# Patient Record
Sex: Male | Born: 1960
Health system: Southern US, Community
[De-identification: ages and names within clinical notes are randomized; demographics above are authoritative.]

## PROBLEM LIST (undated history)

## (undated) DIAGNOSIS — I10 Essential (primary) hypertension: Secondary | ICD-10-CM

## (undated) DIAGNOSIS — E785 Hyperlipidemia, unspecified: Secondary | ICD-10-CM

## (undated) DIAGNOSIS — E669 Obesity, unspecified: Secondary | ICD-10-CM

## (undated) DIAGNOSIS — I499 Cardiac arrhythmia, unspecified: Secondary | ICD-10-CM

## (undated) HISTORY — DX: Cardiac arrhythmia, unspecified: I49.9

## (undated) HISTORY — PX: NECK SURGERY: SHX720

---

## 1997-08-21 ENCOUNTER — Encounter: Admission: RE | Admit: 1997-08-21 | Discharge: 1997-08-21 | Payer: Self-pay | Admitting: *Deleted

## 1999-04-20 ENCOUNTER — Emergency Department (HOSPITAL_COMMUNITY): Admission: EM | Admit: 1999-04-20 | Discharge: 1999-04-21 | Payer: Self-pay | Admitting: Internal Medicine

## 2010-05-06 ENCOUNTER — Ambulatory Visit
Admission: RE | Admit: 2010-05-06 | Discharge: 2010-05-06 | Disposition: A | Discharge status: Home / Self Care | Payer: Managed Care, Other (non HMO) | Source: Ambulatory Visit | Attending: Neurosurgery | Admitting: Neurosurgery

## 2010-05-06 ENCOUNTER — Other Ambulatory Visit: Payer: Self-pay | Admitting: Neurosurgery

## 2010-05-06 DIAGNOSIS — M5020 Other cervical disc displacement, unspecified cervical region: Secondary | ICD-10-CM

## 2012-01-17 ENCOUNTER — Emergency Department (HOSPITAL_COMMUNITY): Payer: Managed Care, Other (non HMO)

## 2012-01-17 ENCOUNTER — Emergency Department (HOSPITAL_COMMUNITY)
Admission: EM | Admit: 2012-01-17 | Discharge: 2012-01-17 | Disposition: A | Discharge status: Home / Self Care | Payer: Managed Care, Other (non HMO) | Attending: Emergency Medicine | Admitting: Emergency Medicine

## 2012-01-17 ENCOUNTER — Encounter (HOSPITAL_COMMUNITY): Payer: Self-pay | Admitting: Emergency Medicine

## 2012-01-17 DIAGNOSIS — Y9389 Activity, other specified: Secondary | ICD-10-CM | POA: Insufficient documentation

## 2012-01-17 DIAGNOSIS — Y92410 Unspecified street and highway as the place of occurrence of the external cause: Secondary | ICD-10-CM | POA: Insufficient documentation

## 2012-01-17 DIAGNOSIS — I10 Essential (primary) hypertension: Secondary | ICD-10-CM | POA: Insufficient documentation

## 2012-01-17 DIAGNOSIS — S335XXA Sprain of ligaments of lumbar spine, initial encounter: Secondary | ICD-10-CM | POA: Insufficient documentation

## 2012-01-17 DIAGNOSIS — T07XXXA Unspecified multiple injuries, initial encounter: Secondary | ICD-10-CM | POA: Insufficient documentation

## 2012-01-17 DIAGNOSIS — R159 Full incontinence of feces: Secondary | ICD-10-CM | POA: Insufficient documentation

## 2012-01-17 DIAGNOSIS — Z79899 Other long term (current) drug therapy: Secondary | ICD-10-CM | POA: Insufficient documentation

## 2012-01-17 HISTORY — DX: Essential (primary) hypertension: I10

## 2012-01-17 LAB — BASIC METABOLIC PANEL
BUN: 19 mg/dL (ref 6–23)
CO2: 25 mEq/L (ref 19–32)
Calcium: 8.9 mg/dL (ref 8.4–10.5)
Chloride: 102 mEq/L (ref 96–112)
Creatinine, Ser: 1.02 mg/dL (ref 0.50–1.35)
Glucose, Bld: 121 mg/dL — ABNORMAL HIGH (ref 70–99)

## 2012-01-17 LAB — CBC WITH DIFFERENTIAL/PLATELET
Eosinophils Relative: 2 % (ref 0–5)
HCT: 39.3 % (ref 39.0–52.0)
Hemoglobin: 13.6 g/dL (ref 13.0–17.0)
Lymphocytes Relative: 34 % (ref 12–46)
MCV: 80.5 fL (ref 78.0–100.0)
Monocytes Absolute: 0.5 10*3/uL (ref 0.1–1.0)
Monocytes Relative: 5 % (ref 3–12)
Neutro Abs: 5.5 10*3/uL (ref 1.7–7.7)
RDW: 13 % (ref 11.5–15.5)
WBC: 9.4 10*3/uL (ref 4.0–10.5)

## 2012-01-17 LAB — URINALYSIS, ROUTINE W REFLEX MICROSCOPIC
Hgb urine dipstick: NEGATIVE
Nitrite: NEGATIVE
Protein, ur: NEGATIVE mg/dL
Specific Gravity, Urine: 1.024 (ref 1.005–1.030)
Urobilinogen, UA: 0.2 mg/dL (ref 0.0–1.0)

## 2012-01-17 LAB — RAPID URINE DRUG SCREEN, HOSP PERFORMED
Amphetamines: NOT DETECTED
Barbiturates: NOT DETECTED
Tetrahydrocannabinol: POSITIVE — AB

## 2012-01-17 MED ORDER — SODIUM CHLORIDE 0.9 % IV SOLN
INTRAVENOUS | Status: DC
  Administered 2012-01-17: 13:00:00 via INTRAVENOUS

## 2012-01-17 MED ORDER — IOHEXOL 300 MG/ML  SOLN: 100.0000 mL | Freq: Once | INTRAMUSCULAR | Status: DC | PRN

## 2012-01-17 MED ORDER — IOHEXOL 300 MG/ML  SOLN
100.0000 mL | Freq: Once | INTRAMUSCULAR | Status: AC | PRN
  Administered 2012-01-17: 100 mL via INTRAVENOUS

## 2012-01-17 MED ORDER — MORPHINE SULFATE 4 MG/ML IJ SOLN
4.0000 mg | Freq: Once | INTRAMUSCULAR | Status: AC
  Administered 2012-01-17: 4 mg via INTRAVENOUS
  Filled 2012-01-17: qty 1

## 2012-01-17 MED ORDER — MORPHINE SULFATE 4 MG/ML IJ SOLN
6.0000 mg | Freq: Once | INTRAMUSCULAR | Status: AC
  Administered 2012-01-17: 6 mg via INTRAVENOUS
  Filled 2012-01-17: qty 2

## 2012-01-17 MED ORDER — OXYCODONE-ACETAMINOPHEN 5-325 MG PO TABS: 2.0000 | ORAL_TABLET | Freq: Four times a day (QID) | ORAL | Status: DC | PRN
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
## 2012-01-17 NOTE — ED Notes (Signed)
Patient transported to CT 

## 2012-04-14 ENCOUNTER — Inpatient Hospital Stay (HOSPITAL_COMMUNITY)
Admission: AD | Admit: 2012-04-14 | Discharge: 2012-04-20 | DRG: 981 | Disposition: A | Discharge status: Home / Self Care | Payer: Managed Care, Other (non HMO) | Source: Ambulatory Visit | Attending: Neurosurgery | Admitting: Neurosurgery

## 2012-04-14 ENCOUNTER — Encounter (HOSPITAL_COMMUNITY): Payer: Self-pay | Admitting: Pulmonary Disease

## 2012-04-14 DIAGNOSIS — I472 Ventricular tachycardia, unspecified: Secondary | ICD-10-CM | POA: Diagnosis present

## 2012-04-14 DIAGNOSIS — D649 Anemia, unspecified: Secondary | ICD-10-CM | POA: Diagnosis present

## 2012-04-14 DIAGNOSIS — J9600 Acute respiratory failure, unspecified whether with hypoxia or hypercapnia: Secondary | ICD-10-CM

## 2012-04-14 DIAGNOSIS — F3289 Other specified depressive episodes: Secondary | ICD-10-CM | POA: Diagnosis present

## 2012-04-14 DIAGNOSIS — I959 Hypotension, unspecified: Secondary | ICD-10-CM | POA: Diagnosis present

## 2012-04-14 DIAGNOSIS — I517 Cardiomegaly: Secondary | ICD-10-CM

## 2012-04-14 DIAGNOSIS — Z79899 Other long term (current) drug therapy: Secondary | ICD-10-CM

## 2012-04-14 DIAGNOSIS — I519 Heart disease, unspecified: Principal | ICD-10-CM | POA: Diagnosis present

## 2012-04-14 DIAGNOSIS — D72829 Elevated white blood cell count, unspecified: Secondary | ICD-10-CM | POA: Diagnosis present

## 2012-04-14 DIAGNOSIS — R7309 Other abnormal glucose: Secondary | ICD-10-CM | POA: Diagnosis present

## 2012-04-14 DIAGNOSIS — E785 Hyperlipidemia, unspecified: Secondary | ICD-10-CM | POA: Diagnosis present

## 2012-04-14 DIAGNOSIS — Y9210 Unspecified residential institution as the place of occurrence of the external cause: Secondary | ICD-10-CM | POA: Diagnosis present

## 2012-04-14 DIAGNOSIS — Y838 Other surgical procedures as the cause of abnormal reaction of the patient, or of later complication, without mention of misadventure at the time of the procedure: Secondary | ICD-10-CM | POA: Diagnosis present

## 2012-04-14 DIAGNOSIS — Z87891 Personal history of nicotine dependence: Secondary | ICD-10-CM

## 2012-04-14 DIAGNOSIS — I2510 Atherosclerotic heart disease of native coronary artery without angina pectoris: Secondary | ICD-10-CM | POA: Diagnosis present

## 2012-04-14 DIAGNOSIS — I4901 Ventricular fibrillation: Secondary | ICD-10-CM | POA: Diagnosis present

## 2012-04-14 DIAGNOSIS — Z01811 Encounter for preprocedural respiratory examination: Secondary | ICD-10-CM

## 2012-04-14 DIAGNOSIS — I10 Essential (primary) hypertension: Secondary | ICD-10-CM | POA: Diagnosis present

## 2012-04-14 DIAGNOSIS — J95821 Acute postprocedural respiratory failure: Secondary | ICD-10-CM | POA: Diagnosis present

## 2012-04-14 DIAGNOSIS — F329 Major depressive disorder, single episode, unspecified: Secondary | ICD-10-CM | POA: Diagnosis present

## 2012-04-14 DIAGNOSIS — E669 Obesity, unspecified: Secondary | ICD-10-CM | POA: Diagnosis present

## 2012-04-14 DIAGNOSIS — I4729 Other ventricular tachycardia: Secondary | ICD-10-CM | POA: Diagnosis present

## 2012-04-14 DIAGNOSIS — J9690 Respiratory failure, unspecified, unspecified whether with hypoxia or hypercapnia: Secondary | ICD-10-CM

## 2012-04-14 DIAGNOSIS — M5126 Other intervertebral disc displacement, lumbar region: Secondary | ICD-10-CM | POA: Diagnosis present

## 2012-04-14 DIAGNOSIS — I469 Cardiac arrest, cause unspecified: Secondary | ICD-10-CM | POA: Diagnosis present

## 2012-04-14 HISTORY — DX: Obesity, unspecified: E66.9

## 2012-04-14 HISTORY — DX: Hyperlipidemia, unspecified: E78.5

## 2012-04-14 LAB — CBC WITH DIFFERENTIAL/PLATELET
Hemoglobin: 13.3 g/dL (ref 13.0–17.0)
Lymphocytes Relative: 5 % — ABNORMAL LOW (ref 12–46)
Lymphs Abs: 0.8 10*3/uL (ref 0.7–4.0)
MCH: 28.9 pg (ref 26.0–34.0)
MCV: 85 fL (ref 78.0–100.0)
Monocytes Relative: 3 % (ref 3–12)
Neutrophils Relative %: 92 % — ABNORMAL HIGH (ref 43–77)
Platelets: 242 10*3/uL (ref 150–400)
RBC: 4.6 MIL/uL (ref 4.22–5.81)
WBC: 17.1 10*3/uL — ABNORMAL HIGH (ref 4.0–10.5)

## 2012-04-14 LAB — COMPREHENSIVE METABOLIC PANEL
ALT: 59 U/L — ABNORMAL HIGH (ref 0–53)
Alkaline Phosphatase: 46 U/L (ref 39–117)
BUN: 14 mg/dL (ref 6–23)
CO2: 25 mEq/L (ref 19–32)
GFR calc Af Amer: >90 mL/min (ref >90)
GFR calc non Af Amer: 85 mL/min — ABNORMAL LOW (ref >90)
Glucose, Bld: 116 mg/dL — ABNORMAL HIGH (ref 70–99)
Potassium: 4.2 mEq/L (ref 3.5–5.1)
Sodium: 139 mEq/L (ref 135–145)
Total Bilirubin: 0.4 mg/dL (ref 0.3–1.2)

## 2012-04-14 LAB — URINALYSIS, ROUTINE W REFLEX MICROSCOPIC
Bilirubin Urine: NEGATIVE
Hgb urine dipstick: NEGATIVE
Nitrite: NEGATIVE
Specific Gravity, Urine: 1.019 (ref 1.005–1.030)
Urobilinogen, UA: 0.2 mg/dL (ref 0.0–1.0)
pH: 5 (ref 5.0–8.0)

## 2012-04-14 LAB — MAGNESIUM: Magnesium: 1.6 mg/dL (ref 1.5–2.5)

## 2012-04-14 LAB — PROTIME-INR: INR: 0.99 (ref 0.00–1.49)

## 2012-04-14 LAB — TROPONIN I: Troponin I: <0.3 ng/mL (ref <0.30)

## 2012-04-14 LAB — CK TOTAL AND CKMB (NOT AT ARMC)
CK, MB: 4.5 ng/mL — ABNORMAL HIGH (ref 0.3–4.0)
Relative Index: 2.5 (ref 0.0–2.5)

## 2012-04-14 MED ORDER — DIAZEPAM 5 MG PO TABS
5.0000 mg | ORAL_TABLET | ORAL | Status: AC
  Administered 2012-04-15: 5 mg via ORAL
  Filled 2012-04-14: qty 1

## 2012-04-14 MED ORDER — ASPIRIN 300 MG RE SUPP
300.0000 mg | RECTAL | Status: AC
  Filled 2012-04-14: qty 1

## 2012-04-14 MED ORDER — SODIUM CHLORIDE 0.9 % IV SOLN
INTRAVENOUS | Status: DC
  Administered 2012-04-15: 04:00:00 via INTRAVENOUS

## 2012-04-14 MED ORDER — SODIUM CHLORIDE 0.9 % IJ SOLN: 3.0000 mL | INTRAMUSCULAR | Status: DC | PRN

## 2012-04-14 MED ORDER — FENTANYL CITRATE 0.05 MG/ML IJ SOLN
25.0000 ug | INTRAMUSCULAR | Status: DC | PRN
  Administered 2012-04-14 – 2012-04-15 (×7): 50 ug via INTRAVENOUS
  Filled 2012-04-14 (×7): qty 2

## 2012-04-14 MED ORDER — ASPIRIN 81 MG PO CHEW
324.0000 mg | CHEWABLE_TABLET | ORAL | Status: AC
  Administered 2012-04-15: 324 mg via ORAL
  Filled 2012-04-14: qty 4

## 2012-04-14 MED ORDER — ASPIRIN 81 MG PO CHEW
324.0000 mg | CHEWABLE_TABLET | ORAL | Status: AC
  Administered 2012-04-14: 324 mg via ORAL
  Filled 2012-04-14: qty 4

## 2012-04-14 MED ORDER — SODIUM CHLORIDE 0.9 % IV SOLN: 250.0000 mL | INTRAVENOUS | Status: DC | PRN

## 2012-04-14 MED ORDER — KCL IN DEXTROSE-NACL 20-5-0.45 MEQ/L-%-% IV SOLN
INTRAVENOUS | Status: DC
  Administered 2012-04-14: 16:00:00 via INTRAVENOUS
  Filled 2012-04-14 (×2): qty 1000

## 2012-04-14 MED ORDER — FENTANYL CITRATE 0.05 MG/ML IJ SOLN
INTRAMUSCULAR | Status: AC
  Filled 2012-04-14: qty 2

## 2012-04-14 MED ORDER — SODIUM CHLORIDE 0.9 % IJ SOLN
3.0000 mL | Freq: Two times a day (BID) | INTRAMUSCULAR | Status: DC
  Administered 2012-04-14: 3 mL via INTRAVENOUS

## 2012-04-14 MED ORDER — SODIUM CHLORIDE 0.9 % IV SOLN
INTRAVENOUS | Status: DC
  Administered 2012-04-14: 11:00:00 via INTRAVENOUS

## 2012-04-14 NOTE — Procedures (Signed)
Extubation Procedure Note  Patient Details:   Name: Justin Tanner DOB: [birth year] MRN: [id number]   Airway Documentation:  Airway 7.5 mm (Active)  Secured at (cm) 24 cm 03/31/2012  9:55 AM  Measured From Lips 04/02/2012  9:55 AM  Secured Location Center 04/09/2012  9:55 AM  Secured By Caron Presume Tape 03/20/2012  9:55 AM  Site Condition Dry 04/05/2012  9:55 AM    Evaluation  O2 sats: 99 Complications: No apparent complications Patient did tolerate procedure well. Bilateral Breath Sounds: Coarse crackles Suctioning: Oral;Airway Yes  Pt is awake and can follow commands. Pt has good cough/gag. Positive cuff leak. Per MD ok to extubate. Pt extubated to 4L [place]. BBS crs, rr 20, hr 88, sats 99. No stridor. Pt tol well  Kandis Nab 03/19/2012, 11:16 AM

## 2012-04-14 NOTE — Progress Notes (Signed)
  Echocardiogram 2D Echocardiogram has been performed.  Jorje Guild 03/29/2012, 1:38 PM

## 2012-04-14 NOTE — Progress Notes (Signed)
Utilization Review Completed.Bethel Gaglio T12/11/2012  

## 2012-04-14 NOTE — Progress Notes (Signed)
Initial dressing from surgical incision soaked through with sanguinous drainage; mattress pads x 2 with drainage noted; physician aware; orders received for dressing changes as needed for continued drainage; pt mae c sensation intact; will continue to monitor
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
## 2012-04-14 NOTE — H&P (Signed)
PULMONARY  / CRITICAL CARE MEDICINE  Name: Justin Tanner MRN: [id number] DOB: 1960-03-27    ADMISSION DATE:  03/27/2012  PCP: Dr. Beryle Flock MD :  Dr. Gerlene Fee  CHIEF COMPLAINT:  Intra-operative cardiac arrest  BRIEF PATIENT DESCRIPTION: 52 y/o M presented to [facility] 1/29 for lumbar micro discectomy of L4-5.  During case experienced VT arrest requiring shock x2 with CPR. Transferred to [facility] intubated.  Awake/alert on arrival and extubated to [place] O2.    SIGNIFICANT EVENTS / STUDIES:  1/29 - Admit after intra-operative cardiac arrest  LINES / TUBES: OETT 1/29>>>1/29  CULTURES: 1/29 UA>>>  ANTIBIOTICS:   HISTORY OF PRESENT ILLNESS:  52 y/o M with PMH of HTN, Obesity, Former ETOH (6pk per day), former occasional smoker who presented to [facility] 1/29 for lumbar micro discectomy of L4-5. Information obtained from CRNA - patient was stable after induction for approx 15-20 minutes.  Was given 15 mg atracurium and then became hypotensive-->50 of ephedrine with improvement in BP to 90's/100's, also given 0.2 mg Robinul for bradycardia.  Was SB in 50's with BP100/55.  Then noted to have a couple of PVC's and went into VT - no pulse check as they were trying to flip patient over to attach pads.  Once turned supine, his rhythm on Zoll was SR for approx 30 seconds and then he went back into VT-->shock x1 followed by 2 min CPR.  2nd rhythm check with VT-->shock x1 with CPR.  3rd rhythm check patient was ST with faint pulses and hypertensive.  Muscle relaxants were reversed and paralytics stopped for transfer.  Transferred to [facility] ICU intubated.  Rhythms from surgical center are faint but confirmed VT (? [place]orsades - similar in appearance).  Pt reports 3-4 month hx of intermittent chest pain associated with pre-syncopal sensation, nausea, and shortness of breath.  Denies current chest pain, nausea / vomiting, chest pain with inspiration, URI  symptoms, constipation, lower extremity swelling.  PAST MEDICAL HISTORY :  Past Medical History  Diagnosis Date  . Hypertension   . Obesity    Past Surgical History  Procedure Date  . Neck surgery    Prior to Admission medications   Medication Sig Start Date End Date Taking? Authorizing Provider  B Complex Vitamins (VITAMIN B COMPLEX PO) Take 1 tablet by mouth daily with lunch.   Yes Historical Provider, MD  citalopram (CELEXA) 40 MG tablet Take 40 mg by mouth daily.   Yes Historical Provider, MD  cyclobenzaprine (FLEXERIL) 10 MG tablet Take 10 mg by mouth 2 (two) times daily as needed. For muscle spasms.   Yes Historical Provider, MD  GARLIC PO Take 1 capsule by mouth daily with lunch.   Yes Historical Provider, MD  lisinopril-hydrochlorothiazide (PRINZIDE,ZESTORETIC) 10-12.5 MG per tablet Take 1 tablet by mouth daily.   Yes Historical Provider, MD  metoprolol succinate (TOPROL-XL) 100 MG 24 hr tablet Take 100 mg by mouth daily. Take with or immediately following a meal.   Yes Historical Provider, MD  oxyCODONE-acetaminophen (PERCOCET/ROXICET) 5-325 MG per tablet Take 2 tablets by mouth every 6 (six) hours as needed for pain. 01/17/12  Yes Doug Sou, MD  pravastatin (PRAVACHOL) 40 MG tablet Take 40 mg by mouth at bedtime.   Yes Historical Provider, MD  vitamin C (ASCORBIC ACID) 500 MG tablet Take 500 mg by mouth daily with lunch.    Yes Historical Provider, MD   No Known Allergies  FAMILY HISTORY:  No family history  on file.  SOCIAL HISTORY:  reports that he has never smoked. He does not have any smokeless tobacco history on file. He reports that he does not drink alcohol or use illicit drugs.  REVIEW OF SYSTEMS:   All systems reviewed - pertinent positives in HPI.    SUBJECTIVE:  Complains of back pain.  Denies shortness of breath / chest pain currently.   VITAL SIGNS: Pulse Rate:  [88] 88  (01/29 1005) Resp:  [18] 18  (01/29 1005) SpO2:  [99 %] 99 % (01/29 1005) FiO2  (%):  [40 %-60 %] 40 % (01/29 1000)  PHYSICAL EXAMINATION: General:  Morbidly obese WM in NAD Neuro:  AAOx4, speech clear, MAE HEENT:  Mm pink/moist, thick neck Cardiovascular:  s1s2 rrr, no m/r/g Lungs:  resp's even/non-labored, lungs bilaterally clear Abdomen:  Obese/soft, bsx4 active Musculoskeletal:  No acute deformities Skin:  Chest skin pad burn noted  No results found for this basename: NA:3,K:3,CL:3,CO2:3,BUN:3,CREATININE:3,GLUCOSE:3 in the last 168 hours No results found for this basename: HGB:3,HCT:3,WBC:3,PLT:3 in the last 168 hours No results found.  EKG:  SR with mild prolonged QT  ASSESSMENT / PLAN:  Cardiac Arrest  HTN HLD Intra-operative cardiac arrest, initial rhythm VT.  Required approx 6 minutes of CPR.  Coronary risk factors include HTN, HLD, obesity, family hx, former occ smoker, ETOH abuse.  Mild soft BP on arrival.   Plan: -Appreciate  Cardiology, likely will need cardiac cath.  Timing to be determined -ASA only for now -SCD's / hold heparin as post surgical procedure -assess troponin, EKG, ECHO -close tele monitoring -assess Hgb A1c given body habitus / risk stratification -NPO x meds -hold anti-hypertensives -hold pravachol   Status Post Microdiscectomy Acute & Chronic Pain Per Dr. Gerlene Fee s/p L4-L5 microdiscectomy.  Back closed.  Dr. Gerlene Fee indicates he needs to go back in for approx 20 minutes to finish procedure  Plan: -Per NSGY -PRN reduced dose fentanyl -hold home chronic pain medications  Acute Respiratory Failure Post CPR, extubated after arrival assessment in ICU.   Plan: -f/u cxr in am to assess for infiltrate   Depression -hold celexa   Canary Brim, NP-C Pulmonary and Critical Care Medicine [facility] Pager: [phone]  03/27/2012, 11:18 AM   Billy Fischer, MD ; [facility] service Mobile [phone].  After 5:30 PM or weekends, call [phone]

## 2012-04-14 NOTE — Progress Notes (Signed)
PULMONARY  / CRITICAL CARE MEDICINE  Name: Justin Tanner MRN: [id number] DOB: [birth year]    LOS: 1  REFERRING MD :  Dr. Gerlene Fee  CHIEF COMPLAINT: Intraoperative cardiac arrest  BRIEF PATIENT DESCRIPTION: 52 y/o M admitted 1/29 after VT arrest following lumbar micro discectomy of L4-5. PRS to require shock x2 with CPR. Transferred to [facility] intubated, extubated 129 to nasal cannula. Pending cardiac cath to evaluate for possible CAD.  LINES / TUBES: ETT 1/29 >> 1/29  CULTURES: 1/29 UA  ANTIBIOTICS: None  SIGNIFICANT EVENTS:  1/29 Admitted after intraoperative cardiac arrest, intubated --> extubated 1/29 1/29 2-D echo-LV EF 45-50%. Grade 1 diastolic dysfunction. Regional wall motion abnormalities cannot be excluded. 1/30 Cardiac cath   LEVEL OF CARE:  ICU PRIMARY SERVICE:  PCCM CONSULTANTS:   cardiology CODE STATUS:   Full DIET:  NPO DVT Px:  SCD GI Px:  Full   SUBJECTIVE / INTERVAL HISTORY:  Patient indicates doing well overnight, continues to have some chest wall pain from resuscitative efforts. Otherwise negative for shortness breath, wheezing, nausea, vomiting, diarrhea.  VITAL SIGNS: Temp:  [97.4 F (36.3 C)-98.2 F (36.8 C)] 97.9 F (36.6 C) (01/30 0400) Pulse Rate:  [65-90] 75  (01/30 0600) Resp:  [10-25] 17  (01/30 0600) BP: (85-118)/(43-77) 110/68 mmHg (01/30 0600) SpO2:  [92 %-100 %] 100 % (01/30 0600) FiO2 (%):  [40 %-60 %] 40 % (01/29 1000) Weight:  [226 lb 10.1 oz (102.8 kg)] 226 lb 10.1 oz (102.8 kg) (01/29 1005) HEMODYNAMICS:   VENTILATOR SETTINGS: Vent Mode:  [-] PSV;CPAP FiO2 (%):  [40 %-60 %] 40 % Set Rate:  [14 bmp] 14 bmp Vt Set:  [600 mL] 600 mL PEEP:  [5 cmH20] 5 cmH20 Pressure Support:  [5 cmH20] 5 cmH20 Plateau Pressure:  [30 cmH20] 30 cmH20 INTAKE / OUTPUT: Intake/Output      01/29 0701 - 01/30 0700 01/30 0701 - 01/31 0700   P.O. 160    I.V. (mL/kg) 1154.6 (11.2)    Total Intake(mL/kg) 1314.6 (12.8)    Urine (mL/kg/hr) 1050  (0.4)    Total Output 1050    Net +264.6           PHYSICAL EXAMINATION: General: Vital signs reviewed and noted. No acute distress.  Resting comfortably on nasal cannula  Head: Normocephalic, atraumatic.  Lungs:  Normal respiratory effort. Clear to auscultation BL without crackles or wheezes.  Heart: RRR. (No) murmur.  Abdomen:  BS normoactive. Soft, Nondistended, non-tender.  No masses or organomegaly.  Extremities: No pretibial edema.  Skin: No appreciable rashes, pallor  Neuro:  alert and oriented x3, no focal deficits      IMAGING: 1/29 - 2D Echo - LVEF 45-50%. Grade 1 diastolic dysfunction.   DIAGNOSES: Principal Problem:  *Sudden cardiac arrest Active Problems:  Sustained VT (ventricular tachycardia)  Respiratory failure  Preoperative respiratory examination  Hypertension   ASSESSMENT / PLAN:  PULMONARY A: Acute respiratory failure - likely secondary to cardiac arrest. Intubated and extubated 1/29. P:  Continue [place]  CARDIOVASCULAR  Lab 03/23/2012 0555 04/01/2012 1112  CKTOTAL -- 183  CKMB -- 4.5*  TROPONINI <0.30 <0.30    Lab 04/10/2012 1112  PROBNP 118.7   A:  1) Sudden V. tach cardiac arrest - likely cardiac enzymes are negative times 2.  2) Sustained VT - no further episodes overnight. 3) Hypertension - blood pressures marginal, remains off of home medications. P:  - Management per cardiology - Pending cardiac catheterization for evaluation of CAD -  Aspirin. - Resume statin, consider beta blocker.  RENAL A: No acute issues P: None  GASTROINTESTINAL  Lab 03/28/2012 0555 04/02/2012 1118  HGB 11.6* 13.3  HCT 34.9* 39.1  AST -- 63*  ALT -- 59*  ALKPHOS -- 46  BILITOT -- 0.4  ALBUMIN -- 3.5   A: 1) Mild AST/ALT elevation - mild, possibly related to his acute illness. Mild elevation could also be consistent with NASH. P: Continue to monitor.  HEMATOLOGIC  Lab 04/04/2012 0555 03/28/2012 1118  WBC 15.6* 17.1*  HGB 11.6* 13.3  HCT 34.9* 39.1  MCV  83.3 85.0  PLT 250 242  APTT -- --  INR -- 0.99   A:  1) Leukocytosis - possible stressed margination after arrest and resuscitative efforts? No clear cause of infection. UA negative. Chest x-ray pending. 2) Normocytic anemia - mild, unknown baseline. P:  Continue to monitor for signs and symptoms of infection.  INFECTIOUS  Lab 03/24/2012 0555 04/06/2012 1122 04/06/2012 1118  WBC 15.6* -- 17.1*  NEUTROABS -- -- 15.7*  LATICACIDVEN -- -- --  PROCALCITON -- <0.10 --   A: No clear source of infection identified. P: Continue to monitor.  ENDOCRINE / RHEUMATOLOGIC No results found for this basename: HGBA1C,  TSH    A: Prediabetes P: Check A1c, ICU hyperglycemia protocol.  NEUROLOGIC / PSYCHIATRIC   A: Depression - home regimen: Celexa 40 mg P: Resume home medications once taking oral.    CLINICAL SUMMARY: 52 y/o M admitted 1/29 after VT arrest following lumbar micro discectomy of L4-5. PRS to require shock x2 with CPR. Transferred to [facility] intubated, extubated 129 to nasal cannula. Pending cardiac cath to evaluate for possible CAD.   Signed: Johnette Abraham, Roma Schanz, Internal Medicine Resident Pager: [phone] (7AM-5PM)    STAFF NOTE: I, Dr Lavinia Sharps have personally reviewed patient's available data, including medical history, events of note, physical examination and test results as part of my evaluation. I have discussed with resident/NP and other care providers such as pharmacist, RN and RRT.  In addition,  I personally evaluated patient and elicited key findings of acute respiratory failure following cardiac arrest. Currently doing well. He is an ex-smoker so we will get pulmonary function testing. However looks to be low to moderate risk from risks standpoint of pulmonary complications following spine surgery.    Rest per NP/medical resident whose note is outlined above and that I agree with      Dr. Kalman Shan, M.D., [facility].C.P Pulmonary and Critical Care  Medicine Staff Physician [facility] [place] Pulmonary and Critical Care Pager: [phone], If no answer or between  15:00h - 7:00h: call [phone]  03/28/2012 4:47 PM

## 2012-04-15 ENCOUNTER — Inpatient Hospital Stay (HOSPITAL_COMMUNITY): Payer: Managed Care, Other (non HMO)

## 2012-04-15 ENCOUNTER — Encounter (HOSPITAL_COMMUNITY)
Admission: AD | Disposition: A | Discharge status: Home / Self Care | Payer: Self-pay | Source: Ambulatory Visit | Attending: Neurosurgery

## 2012-04-15 DIAGNOSIS — I2510 Atherosclerotic heart disease of native coronary artery without angina pectoris: Secondary | ICD-10-CM

## 2012-04-15 DIAGNOSIS — Z01811 Encounter for preprocedural respiratory examination: Secondary | ICD-10-CM

## 2012-04-15 HISTORY — PX: LEFT HEART CATHETERIZATION WITH CORONARY ANGIOGRAM: SHX5451

## 2012-04-15 LAB — HEMOGLOBIN A1C
Hgb A1c MFr Bld: 6 % — ABNORMAL HIGH (ref <5.7)
Mean Plasma Glucose: 126 mg/dL — ABNORMAL HIGH (ref <117)

## 2012-04-15 LAB — BASIC METABOLIC PANEL
Chloride: 104 mEq/L (ref 96–112)
GFR calc Af Amer: >90 mL/min (ref >90)
GFR calc non Af Amer: >90 mL/min (ref >90)
Potassium: 4.4 mEq/L (ref 3.5–5.1)
Sodium: 139 mEq/L (ref 135–145)

## 2012-04-15 LAB — CBC
MCHC: 33.2 g/dL (ref 30.0–36.0)
RDW: 13.4 % (ref 11.5–15.5)
WBC: 15.6 10*3/uL — ABNORMAL HIGH (ref 4.0–10.5)

## 2012-04-15 LAB — TROPONIN I: Troponin I: <0.3 ng/mL (ref <0.30)

## 2012-04-15 SURGERY — LEFT HEART CATHETERIZATION WITH CORONARY ANGIOGRAM
Anesthesia: LOCAL

## 2012-04-15 MED ORDER — OXYCODONE-ACETAMINOPHEN 5-325 MG PO TABS
2.0000 | ORAL_TABLET | Freq: Four times a day (QID) | ORAL | Status: DC | PRN
  Administered 2012-04-15 (×2): 2 via ORAL
  Filled 2012-04-15 (×2): qty 2

## 2012-04-15 MED ORDER — ONDANSETRON HCL 4 MG/2ML IJ SOLN: 4.0000 mg | Freq: Four times a day (QID) | INTRAMUSCULAR | Status: DC | PRN

## 2012-04-15 MED ORDER — MIDAZOLAM HCL 2 MG/2ML IJ SOLN
INTRAMUSCULAR | Status: AC
  Filled 2012-04-15: qty 2

## 2012-04-15 MED ORDER — CITALOPRAM HYDROBROMIDE 40 MG PO TABS
40.0000 mg | ORAL_TABLET | Freq: Every day | ORAL | Status: DC
  Administered 2012-04-15 – 2012-04-19 (×5): 40 mg via ORAL
  Filled 2012-04-15 (×5): qty 1

## 2012-04-15 MED ORDER — HEPARIN (PORCINE) IN NACL 2-0.9 UNIT/ML-% IJ SOLN
INTRAMUSCULAR | Status: AC
  Filled 2012-04-15: qty 1000

## 2012-04-15 MED ORDER — OXYCODONE-ACETAMINOPHEN 5-325 MG PO TABS: 2.0000 | ORAL_TABLET | ORAL | Status: DC | PRN

## 2012-04-15 MED ORDER — SODIUM CHLORIDE 0.9 % IV SOLN
1.0000 mL/kg/h | INTRAVENOUS | Status: AC
  Administered 2012-04-15: 1 mL/kg/h via INTRAVENOUS

## 2012-04-15 MED ORDER — LIDOCAINE HCL (PF) 1 % IJ SOLN
INTRAMUSCULAR | Status: AC
  Filled 2012-04-15: qty 30

## 2012-04-15 MED ORDER — ACETAMINOPHEN 325 MG PO TABS: 650.0000 mg | ORAL_TABLET | ORAL | Status: DC | PRN

## 2012-04-15 MED ORDER — FENTANYL CITRATE 0.05 MG/ML IJ SOLN
INTRAMUSCULAR | Status: AC
  Filled 2012-04-15: qty 2

## 2012-04-15 MED ORDER — VERAPAMIL HCL 2.5 MG/ML IV SOLN
INTRAVENOUS | Status: AC
  Filled 2012-04-15: qty 2

## 2012-04-15 MED ORDER — SIMVASTATIN 20 MG PO TABS
20.0000 mg | ORAL_TABLET | Freq: Every day | ORAL | Status: DC
  Administered 2012-04-15 – 2012-04-18 (×5): 20 mg via ORAL
  Filled 2012-04-15 (×5): qty 1

## 2012-04-15 MED ORDER — OXYCODONE-ACETAMINOPHEN 5-325 MG PO TABS
2.0000 | ORAL_TABLET | ORAL | Status: DC | PRN
  Administered 2012-04-15 – 2012-04-19 (×15): 2 via ORAL
  Filled 2012-04-15 (×15): qty 2

## 2012-04-15 MED ORDER — NITROGLYCERIN IN D5W 200-5 MCG/ML-% IV SOLN
INTRAVENOUS | Status: AC
  Filled 2012-04-15: qty 250

## 2012-04-15 MED ORDER — NITROGLYCERIN 1 MG/10 ML FOR IR/CATH LAB
INTRA_ARTERIAL | Status: AC
  Filled 2012-04-15: qty 10

## 2012-04-15 MED ORDER — HEPARIN SODIUM (PORCINE) 1000 UNIT/ML IJ SOLN
INTRAMUSCULAR | Status: AC
  Filled 2012-04-15: qty 1

## 2012-04-15 MED ORDER — METOPROLOL TARTRATE 25 MG PO TABS
25.0000 mg | ORAL_TABLET | Freq: Two times a day (BID) | ORAL | Status: DC
  Administered 2012-04-15 – 2012-04-18 (×7): 25 mg via ORAL
  Filled 2012-04-15 (×9): qty 1

## 2012-04-15 NOTE — Progress Notes (Signed)
Patient ID: Justin Tanner, male   DOB: 10-02-1960, 52 y.o.   MRN: [id number] Patient looks fine post cath. He says that the initial report is that everything looks good on his cath study, but waiting for the official report. If it is clear, then we need to discuss when he can undergo general anaesthesia to com[plete his surgery. Will look for input from cardiology and CCM.

## 2012-04-15 NOTE — Interval H&P Note (Signed)
History and Physical Interval Note:  03/28/2012 7:48 AM  Justin Tanner  has presented today for surgery, with the diagnosis of cp  The various methods of treatment have been discussed with the patient and family. After consideration of risks, benefits and other options for treatment, the patient has consented to  Procedure(s) (LRB) with comments: LEFT HEART CATHETERIZATION WITH CORONARY ANGIOGRAM (N/A) as a surgical intervention .  The patient's history has been reviewed, patient examined, no change in status, stable for surgery.  I have reviewed the patient's chart and labs.  Questions were answered to the patient's satisfaction.     Theron Arista [facility] 03/17/2012 7:48 AM

## 2012-04-15 NOTE — CV Procedure (Signed)
   Cardiac Catheterization Procedure Note  Name: Justin Tanner MRN: [id number] DOB: 07-Feb-1961  Procedure: Left Heart Cath, Selective Coronary Angiography, LV angiography  Indication: 52 year old white male who developed sustained ventricular tachycardia during lumbar discectomy.   Procedural Details: The right wrist was prepped, draped, and anesthetized with 1% lidocaine. Using the modified Seldinger technique, a 5 French sheath was introduced into the right radial artery. 3 mg of verapamil was administered through the sheath, weight-based unfractionated heparin was administered intravenously. Standard Judkins catheters were used for selective coronary angiography and left ventriculography. Catheter exchanges were performed over an exchange length guidewire. There were no immediate procedural complications. A TR band was used for radial hemostasis at the completion of the procedure.  The patient was transferred to the post catheterization recovery area for further monitoring.  Procedural Findings: Hemodynamics: AO 91/56 with a mean of 72 mmHg LV 98/19 mmHg  Coronary angiography: Coronary dominance: right  Left mainstem: Normal.  Left anterior descending (LAD): There is mild calcification in the proximal LAD. There is mild disease in the mid vessel up to 20-30%. There is also some systolic compression of the mid LAD. The diagonal has 30-40% disease proximally.  Left circumflex (LCx): The left circumflex is a large vessel which gives rise to a single large marginal branch which then trifurcates. Has mild disease less than 20%.  Right coronary artery (RCA): The right coronary is a dominant vessel. In the mid vessel there is a long segment of disease up to 30%.  Left ventriculography: Left ventricular systolic function is normal, LVEF is estimated at 65%, there is no significant mitral regurgitation   Final Conclusions:   1. Nonobstructive coronary disease. 2. Normal left ventricular  function.  Recommendations: Patient will be hydrated for his relative hypotension. We will obtain an echocardiogram. We will ask EP to see for evaluation of his ventricular tachycardia.  Theron Arista [facility] May 09, 2012, 8:17 AM

## 2012-04-15 NOTE — Consult Note (Signed)
ELECTROPHYSIOLOGY CONSULT NOTE    Patient ID: Justin Tanner MRN: [id number], DOB/AGE: 52/30/1962 52 y.o.  Admit date: 03/21/2012 Date of Consult: 03/27/2012  Primary Physician: Lesly Rubenstein, MD in [place] Primary Cardiologist: new to [facility]  Reason for Consultation: VT  HPI:  Justin Tanner is a 52 year old male whom we have been asked to see in consult for PMVT during back surgery.  His past medical history is significant for hypertension, obesity, and hyperlipidemia.  ROS is positive for snoring, daytime somnolence, reflux and back pain.  He denies chest pain, shortness of breath, palpitations, syncope, dizziness, or pre-syncope.  He saw his primary care physician in the fall of last year and was started on Metoprolol for hypertension.   He was at [facility] yesterday undergoing lumbar discectomy.  I spoke with Britta Mccreedy, RN at the surgical center who reports that patient was changed to the prone position, had several PVC's and then went into VT.  He was immediately changed back to the supine position with restoration of sinus rhythm.  He was once again placed prone and had recurrence of VT with PVC initiation.  He was then placed supine and CPR was initiated with shock from AED.  He was then transferred to [facility] for further evaluation.  Per the CRNA who was managing the patient: was stable after induction for approx 15-20 minutes. Was given atracurium and then became hypotensive with SBP 60s-->50 of ephedrine with improvement in BP to 90's/100's, also given 0.2 mg/kg Robinul for bradycardia. Was SB in 50's with BP100/55. Then noted to have a couple of PVC's and went into VT - no pulse check as they were trying to flip patient over to attach pads. Once turned supine, his rhythm on Zoll was SR for approx 30 seconds and then he went back into VT-->shock x1 followed by 2 min CPR. 2nd rhythm check with VT-->shock x1 with CPR. 3rd rhythm check patient was ST with faint pulses and  hypertensive. Muscle relaxants were reversed and paralytics stopped for transfer. Total time of arrest was 15-20 min. Transferred to [facility] ICU intubated.   The patient underwent cardiac catheterization today which demonstrated non obstructive coronary disease and normal LV function.  There is no family history of sudden cardiac death, crib death, unexplained drowning, etc.    Family history is significant for heart disease (father) and cancer (mother).   ROS is negative except as outlined above.      Past Medical History  Diagnosis Date  . Hypertension   . Obesity   . HLD (hyperlipidemia)      Surgical History:  Past Surgical History  Procedure Date  . Neck surgery      Prescriptions prior to admission  Medication Sig Dispense Refill  . B Complex Vitamins (VITAMIN B COMPLEX PO) Take 1 tablet by mouth daily with lunch.      . citalopram (CELEXA) 40 MG tablet Take 40 mg by mouth daily.      . cyclobenzaprine (FLEXERIL) 10 MG tablet Take 10 mg by mouth 2 (two) times daily as needed. For muscle spasms.      Marland Kitchen GARLIC PO Take 1 capsule by mouth daily with lunch.      . lisinopril-hydrochlorothiazide (PRINZIDE,ZESTORETIC) 10-12.5 MG per tablet Take 1 tablet by mouth daily.      . metoprolol succinate (TOPROL-XL) 100 MG 24 hr tablet Take 100 mg by mouth daily. Take with or immediately following a meal.      .  oxyCODONE-acetaminophen (PERCOCET/ROXICET) 5-325 MG per tablet Take 2 tablets by mouth every 6 (six) hours as needed for pain.  20 tablet  0  . pravastatin (PRAVACHOL) 40 MG tablet Take 40 mg by mouth at bedtime.      . vitamin C (ASCORBIC ACID) 500 MG tablet Take 500 mg by mouth daily with lunch.         Inpatient Medications:    . citalopram  40 mg Oral Daily  . simvastatin  20 mg Oral q1800    Allergies: No Known Allergies  History   Social History  . Marital Status: Single    Spouse Name: N/A    Number of Children: N/A  . Years of Education: N/A    Occupational History  . Ironworker - heavy work    Social History Main Topics  . Smoking status: Former Games developer  . Smokeless tobacco: Not on file  . Alcohol Use: No     Comment: Former ETOH use - up to 1 6pk per day.    . Drug Use: No  . Sexually Active: Not on file   Other Topics Concern  . Not on file   Social History Narrative   Lives with wife, no tobacco, borderline DM     Family History  Problem Relation Age of Onset  . Hypertension Father     Physical Exam  Well appearing middle aged man, NAD HEENT: Unremarkable Neck:  No JVD, no thyromegally Lungs:  Clear with no wheezes, rales, or rhonchi HEART:  Regular rate rhythm, no murmurs, no rubs, no clicks Abd:  Flat, positive bowel sounds, no organomegally, no rebound, no guarding Ext:  2 plus pulses, no edema, no cyanosis, no clubbing Skin:  No rashes no nodules Neuro:  CN II through XII intact, motor grossly intact   Labs:   Lab Results  Component Value Date   WBC 15.6* 03/30/2012   HGB 11.6* 03/27/2012   HCT 34.9* 03/26/2012   MCV 83.3 03/21/2012   PLT 250 03/30/2012    Lab 03/23/2012 0555 04/08/2012 1118  NA 139 --  K 4.4 --  CL 104 --  CO2 26 --  BUN 13 --  CREATININE 0.90 --  CALCIUM 8.8 --  PROT -- 6.4  BILITOT -- 0.4  ALKPHOS -- 46  ALT -- 59*  AST -- 63*  GLUCOSE 124* --   Lab Results  Component Value Date   CKTOTAL 183 03/25/2012   CKMB 4.5* 03/28/2012   TROPONINI <0.30 03/25/2012     Radiology/Studies: Dg Chest Port 1 View 04/02/2012  *RADIOLOGY REPORT*  Clinical Data: Shortness of breath.  PORTABLE CHEST - 1 VIEW  Comparison: Chest x-ray 01/17/2012.  Findings: Linear opacity at the left base is favored to represent subsegmental atelectasis.  No definite consolidative airspace disease.  No pleural effusions.  The pulmonary vasculature and the cardiomediastinal silhouette are within normal limits. Atherosclerosis in the thoracic aorta.  Orthopedic fixation hardware in the lower cervical spine.   IMPRESSION: 1.  Subsegmental atelectasis in the left lower lobe. 2.  Atherosclerosis.   Original Report Authenticated By: Trudie Reed, M.D.     WJX:[id number] rhythm, rate 67 QTc 483.  No old EKG's available to compare in MUSE.   TELEMETRY: sinus rhythm  A/P 1. PMVT/VF - etiology almost certainly related to hypotension and neosynephrine administration. He has normal LV function and no CAD. I cannot definitively rule out underlying long QT as the cause of his episode but it would be unusual for a  52 yo man to present with sudden death as his initial manifestation of long QT. I would continue beta blocker as tolerated and allow for him to return to the OR to complete his back surgery. Would avoid all QT prolonging drugs and follow ECGs daily.  Leonia Reeves.D.

## 2012-04-16 ENCOUNTER — Inpatient Hospital Stay (HOSPITAL_COMMUNITY): Payer: Managed Care, Other (non HMO)

## 2012-04-16 ENCOUNTER — Encounter (HOSPITAL_COMMUNITY): Payer: Self-pay | Admitting: Pharmacy Technician

## 2012-04-16 ENCOUNTER — Other Ambulatory Visit (HOSPITAL_COMMUNITY): Payer: Self-pay | Admitting: Radiology

## 2012-04-16 DIAGNOSIS — I2510 Atherosclerotic heart disease of native coronary artery without angina pectoris: Secondary | ICD-10-CM | POA: Diagnosis present

## 2012-04-16 LAB — PULMONARY FUNCTION TEST

## 2012-04-16 MED ORDER — ALBUTEROL SULFATE (5 MG/ML) 0.5% IN NEBU
2.5000 mg | INHALATION_SOLUTION | Freq: Once | RESPIRATORY_TRACT | Status: AC
  Administered 2012-04-16: 09:00:00 2.5 mg via RESPIRATORY_TRACT

## 2012-04-16 NOTE — Progress Notes (Signed)
    SUBJECTIVE: No complaints this am. No chest pain or SOB. Cath site right wrist is ok.   BP 107/59  Pulse 68  Temp 97.8 F (36.6 C) (Oral)  Resp 17  Ht 5\' 9"  (1.753 m)  Wt 237 lb 3.4 oz (107.6 kg)  BMI 35.03 kg/m2  SpO2 96%  Intake/Output Summary (Last 24 hours) at 03/22/2012 0700 Last data filed at 03/19/2012 0445  Gross per 24 hour  Intake 1130.8 ml  Output   2425 ml  Net -1294.2 ml    PHYSICAL EXAM General: Well developed, well nourished, in no acute distress. Alert and oriented x 3.  Psych:  Good affect, responds appropriately Neck: No JVD. No masses noted.  Lungs: Clear bilaterally with no wheezes or rhonci noted.  Heart: RRR with no murmurs noted. Abdomen: Bowel sounds are present. Soft, non-tender.  Extremities: No lower extremity edema.   LABS: Basic Metabolic Panel:  Basename 04/09/2012 0555 2012-05-01 1118  NA 139 139  K 4.4 4.2  CL 104 102  CO2 26 25  GLUCOSE 124* 116*  BUN 13 14  CREATININE 0.90 1.00  CALCIUM 8.8 8.5  MG -- 1.6  PHOS -- 2.4   CBC:  Basename 03/22/2012 0555 May 01, 2012 1118  WBC 15.6* 17.1*  NEUTROABS -- 15.7*  HGB 11.6* 13.3  HCT 34.9* 39.1  MCV 83.3 85.0  PLT 250 242   Cardiac Enzymes:  Basename 03/18/2012 0555 May 01, 2012 1112  CKTOTAL -- 183  CKMB -- 4.5*  CKMBINDEX -- --  TROPONINI <0.30 <0.30   Current Meds:    . citalopram  40 mg Oral Daily  . metoprolol tartrate  25 mg Oral BID  . simvastatin  20 mg Oral q1800   ASSESSMENT AND PLAN:  1. VT arrest during surgical procedure with general anesthesia: Cath yesterday with mild non-obstructive CAD. LV function is normal. Etiology likely hypotension and neosynephrine administration. He was seen by Electrophysiology yesterday and recommendations are to continue beta blocker, avoid QT prolonging medications and get daily EKG to check QT interval. OK to proceed with remainder of surgery from cardiac standpoint.   MCALHANY,CHRISTOPHER  1/31/20147:00 AM

## 2012-04-16 NOTE — Progress Notes (Signed)
Patient ID: Justin Tanner, male   DOB: [birth year], 52 y.o.   MRN: [id number] Subjective: Patient reports feeling well  Objective: Vital signs in last 24 hours: Temp:  [97.3 F (36.3 C)-98.4 F (36.9 C)] 98.4 F (36.9 C) (01/31 1206) Pulse Rate:  [63-92] 69  (01/31 1206) Resp:  [12-23] 12  (01/31 1206) BP: (100-124)/(57-79) 111/70 mmHg (01/31 1206) SpO2:  [95 %-99 %] 96 % (01/31 1206) Weight:  [107.6 kg (237 lb 3.4 oz)] 107.6 kg (237 lb 3.4 oz) (01/31 0030)  Intake/Output from previous day: 01/30 0701 - 01/31 0700 In: 1130.8 [I.V.:1130.8] Out: 2425 [Urine:2425] Intake/Output this shift: Total I/O In: 480 [P.O.:480] Out: 400 [Urine:400]  Wound:clean and dry  Lab Results:  [facility] 03/17/2012 0555 18-Apr-2012 1118  WBC 15.6* 17.1*  HGB 11.6* 13.3  HCT 34.9* 39.1  PLT 250 242   BMET  Basename 04/12/2012 0555 04-18-2012 1118  NA 139 139  K 4.4 4.2  CL 104 102  CO2 26 25  GLUCOSE 124* 116*  BUN 13 14  CREATININE 0.90 1.00  CALCIUM 8.8 8.5    Studies/Results: Dg Chest Port 1 View  04/05/2012  *RADIOLOGY REPORT*  Clinical Data: Shortness of breath.  PORTABLE CHEST - 1 VIEW  Comparison: Chest x-ray 01/17/2012.  Findings: Linear opacity at the left base is favored to represent subsegmental atelectasis.  No definite consolidative airspace disease.  No pleural effusions.  The pulmonary vasculature and the cardiomediastinal silhouette are within normal limits. Atherosclerosis in the thoracic aorta.  Orthopedic fixation hardware in the lower cervical spine.  IMPRESSION: 1.  Subsegmental atelectasis in the left lower lobe. 2.  Atherosclerosis.   Original Report Authenticated By: Trudie Reed, M.D.     Assessment/Plan: Doing well from cardiac stand point. Needs additional surgery to be done during good working hours here at the hospital. First time for that is 715 Monday morning. He is scheduled for then. Will proceed with surgery then. Risks benefits covered again. Questions  answered. He requests that we proceed.  LOS: 2 days  as above   Reinaldo Meeker, MD 03/20/2012, 2:30 PM

## 2012-04-16 NOTE — Progress Notes (Signed)
PULMONARY  / CRITICAL CARE MEDICINE  Name: Justin Tanner MRN: [id number] DOB: [birth year]    LOS: 2  REFERRING MD :  Dr. Gerlene Fee  CHIEF COMPLAINT: Intraoperative cardiac arrest  BRIEF PATIENT DESCRIPTION: 52 y/o M admitted 1/29 after VT arrest following lumbar micro discectomy of L4-5. PRS to require shock x2 with CPR. Transferred to [facility] MH intubated, extubated 1/29 to nasal cannula. Pending cardiac cath to evaluate for possible CAD.  LINES / TUBES: ETT 1/29 >> 1/29  CULTURES: 1/29 UA  ANTIBIOTICS: None  SIGNIFICANT EVENTS:  1/29 Admitted after intraoperative cardiac arrest, intubated --> extubated 1/29 1/29 2-D echo-LV EF 45-50%. Grade 1 diastolic dysfunction. Regional wall motion abnormalities cannot be excluded. 1/30 Cardiac cath   LEVEL OF CARE:  ICU PRIMARY SERVICE:  PCCM CONSULTANTS:   cardiology CODE STATUS:   Full DIET:  NPO DVT Px:  SCD GI Px:  Full   SUBJECTIVE / INTERVAL HISTORY:  Reports chest soreness from CPR and low back pain.  No acute events.    VITAL SIGNS: Temp:  [97.3 F (36.3 C)-98.4 F (36.9 C)] 98.4 F (36.9 C) (01/31 1206) Pulse Rate:  [63-92] 69  (01/31 1206) Resp:  [12-23] 12  (01/31 1206) BP: (100-124)/(57-79) 111/70 mmHg (01/31 1206) SpO2:  [95 %-99 %] 96 % (01/31 1206) Weight:  [237 lb 3.4 oz (107.6 kg)] 237 lb 3.4 oz (107.6 kg) (01/31 0030)  INTAKE / OUTPUT: Intake/Output      01/30 0701 - 01/31 0700 01/31 0701 - 02/01 0700   P.O.  480   I.V. (mL/kg) 1130.8 (10.5)    Total Intake(mL/kg) 1130.8 (10.5) 480 (4.5)   Urine (mL/kg/hr) 2425 (0.9) 400 (0.7)   Total Output 2425 400   Net -1294.2 +80          PHYSICAL EXAMINATION: General: No acute distress.  Resting comfortably on nasal cannula  Head: Normocephalic, atraumatic.  Lungs:  Normal respiratory effort. Clear to auscultation BL without crackles or wheezes.  Heart: RRR. (No) murmur.  Abdomen:  BS normoactive. Soft, Nondistended, non-tender.  No masses or organomegaly.   Extremities: No pretibial edema.  Skin: No appreciable rashes, pallor  Neuro:  alert and oriented x3, no focal deficits      IMAGING: 1/29 - 2D Echo - LVEF 45-50%. Grade 1 diastolic dysfunction.   DIAGNOSES: Principal Problem:  *Sudden cardiac arrest Active Problems:  Sustained VT (ventricular tachycardia)  Hypertension  Respiratory failure  Preoperative respiratory examination  Coronary atherosclerosis of native coronary artery   ASSESSMENT / PLAN:  PULMONARY A:  Acute respiratory failure - in setting of surgery / intra-op cardiac arrest. Intubated and extubated 1/29.  P:   Continue [place] Pending PFT's (done on 1/31) - likely can return to OR for completion of surgery.    CARDIOVASCULAR  Lab 03/25/2012 0555 2012-05-14 1112  CKTOTAL -- 183  CKMB -- 4.5*  TROPONINI <0.30 <0.30    Lab 2012-05-14 1112  PROBNP 118.7   A:  1) Sudden V. tach cardiac arrest - cardiac enzymes are negative times 2. Likely related to hypotension & neo administration.  Non-obs disease on cath.   2) Sustained VT - no further episodes overnight. 3) Hypertension - blood pressures marginal, remains off of home medications.  P:  - Management per cardiology - Aspirin. - Resume statin, consider beta blocker.  RENAL A: No acute issues  P: None  GASTROINTESTINAL  Lab 04/14/2012 0555 05/14/2012 1118  HGB 11.6* 13.3  HCT 34.9* 39.1  AST -- 63*  ALT -- 59*  ALKPHOS -- 46  BILITOT -- 0.4  ALBUMIN -- 3.5   A:  1) Mild AST/ALT elevation - mild, possibly related to his acute illness. Mild elevation could also be consistent with NASH.  P: Continue to monitor.  HEMATOLOGIC  Lab 03/19/2012 0555 04/02/2012 1118  WBC 15.6* 17.1*  HGB 11.6* 13.3  HCT 34.9* 39.1  MCV 83.3 85.0  PLT 250 242  APTT -- --  INR -- 0.99   A:  1) Leukocytosis - possible stressed margination after arrest and resuscitative efforts? No clear cause of infection. UA negative. Chest x-ray pending. 2) Normocytic anemia - mild,  unknown baseline.  P:  Continue to monitor for signs and symptoms of infection.  INFECTIOUS  Lab 03/26/2012 0555 04/02/2012 1122 03/24/2012 1118  WBC 15.6* -- 17.1*  NEUTROABS -- -- 15.7*  LATICACIDVEN -- -- --  PROCALCITON -- <0.10 --   A: No clear source of infection identified.  P: Continue to monitor.  ENDOCRINE / RHEUMATOLOGIC   A: Prediabetes  P:  A1c - 6 ICU hyperglycemia protocol.  NEUROLOGIC / PSYCHIATRIC   A:  Depression - home regimen: Celexa 40 mg  P: Resume home medications once taking oral.    CLINICAL SUMMARY: 52 y/o M admitted 1/29 after VT arrest following lumbar micro discectomy of L4-5. PRS to require shock x2 with CPR. Transferred to [facility] MH intubated, extubated 129 to nasal cannula. Underwent cardiac cath with mild non-obstructive disease.  PFT's pending.    Consider transfer back to Dr. Despina Arias service.   Canary Brim, NP-C [place] [facility] Pgr: [phone] or [phone] 03/27/2012 12:30 PM      STAFF NOTE: I, Dr Lavinia Sharps have personally reviewed patient's available data, including medical history, events of note, physical examination and test results as part of my evaluation. I have discussed with resident/NP and other care providers such as pharmacist, RN and RRT.  In addition,  I personally evaluated patient and elicited key findings of acute respiratory failure following cardiac arrest during surgery. Currently well from a respiratory standpoint. Pulmonary critical care medicine will sign off. Dr. Aliene Beams of neurosurgery will assume primary service as discussed with him.Marland Kitchen  Rest per NP/medical resident whose note is outlined above and that I agree with    Dr. Kalman Shan, M.D., [facility].C.P Pulmonary and Critical Care Medicine Staff Physician [facility]  Pulmonary and Critical Care Pager: [phone], If no answer or between  15:00h - 7:00h: call [phone]  04/16/2012 4:44 PM

## 2012-04-16 NOTE — Progress Notes (Signed)
Concurrent Utilization Review Completed Elania Crowl J. Constantino Starace, RN, BSN, NCM [phone]  

## 2012-04-16 NOTE — Progress Notes (Signed)
PFT completed. Unconfirmed results placed in Shadow Chart flow sheets section.

## 2012-04-17 DIAGNOSIS — 419620001 Death: Secondary | SNOMED CT

## 2012-04-17 NOTE — Progress Notes (Signed)
Patient ID: Justin Tanner, male   DOB: [birth year], 52 y.o.   MRN: [id number] Patient awaiting surgery on Monday. We'll transfer to telemetry in hospital.

## 2012-04-17 DEATH — deceased

## 2012-04-18 NOTE — Progress Notes (Signed)
Patient ID: Justin Tanner, male   DOB: 1961-02-06, 52 y.o.   MRN: [id number] For surgery in Am per Dr.Kritzer. He will obtain consent prior to surgery.

## 2012-04-19 ENCOUNTER — Inpatient Hospital Stay (HOSPITAL_COMMUNITY): Payer: Managed Care, Other (non HMO) | Admitting: Anesthesiology

## 2012-04-19 ENCOUNTER — Encounter (HOSPITAL_COMMUNITY): Payer: Self-pay | Admitting: Anesthesiology

## 2012-04-19 ENCOUNTER — Encounter (HOSPITAL_COMMUNITY)
Admission: AD | Disposition: A | Discharge status: Home / Self Care | Payer: Self-pay | Source: Ambulatory Visit | Attending: Neurosurgery

## 2012-04-19 HISTORY — PX: LUMBAR LAMINECTOMY/DECOMPRESSION MICRODISCECTOMY: SHX5026

## 2012-04-19 SURGERY — LUMBAR LAMINECTOMY/DECOMPRESSION MICRODISCECTOMY 1 LEVEL
Anesthesia: General | Site: Back | Laterality: Right | Wound class: Clean

## 2012-04-19 MED ORDER — LIDOCAINE HCL (CARDIAC) 20 MG/ML IV SOLN
INTRAVENOUS | Status: DC | PRN
  Administered 2012-04-19: 100 mg via INTRAVENOUS

## 2012-04-19 MED ORDER — CEFAZOLIN SODIUM-DEXTROSE 2-3 GM-% IV SOLR
INTRAVENOUS | Status: DC | PRN
  Administered 2012-04-19: 2 g via INTRAVENOUS

## 2012-04-19 MED ORDER — SODIUM CHLORIDE 0.9 % IJ SOLN: 3.0000 mL | INTRAMUSCULAR | Status: DC | PRN

## 2012-04-19 MED ORDER — THROMBIN 5000 UNITS EX KIT
PACK | CUTANEOUS | Status: DC | PRN
  Administered 2012-04-19 (×2): 5000 [IU] via TOPICAL

## 2012-04-19 MED ORDER — ACETAMINOPHEN 650 MG RE SUPP: 650.0000 mg | RECTAL | Status: DC | PRN

## 2012-04-19 MED ORDER — MENTHOL 3 MG MT LOZG
1.0000 | LOZENGE | OROMUCOSAL | Status: DC | PRN
  Filled 2012-04-19: qty 9

## 2012-04-19 MED ORDER — SODIUM CHLORIDE 0.9 % IR SOLN
Status: DC | PRN
  Administered 2012-04-19: 08:00:00

## 2012-04-19 MED ORDER — SODIUM CHLORIDE 0.9 % IJ SOLN
3.0000 mL | Freq: Two times a day (BID) | INTRAMUSCULAR | Status: DC
  Administered 2012-04-19: 3 mL via INTRAVENOUS

## 2012-04-19 MED ORDER — GLYCOPYRROLATE 0.2 MG/ML IJ SOLN
INTRAMUSCULAR | Status: DC | PRN
  Administered 2012-04-19: .6 mg via INTRAVENOUS
  Administered 2012-04-19: 0.1 mg via INTRAVENOUS
  Administered 2012-04-19: 0.2 mg via INTRAVENOUS
  Administered 2012-04-19: 0.1 mg via INTRAVENOUS

## 2012-04-19 MED ORDER — ONDANSETRON HCL 4 MG/2ML IJ SOLN: 4.0000 mg | Freq: Once | INTRAMUSCULAR | Status: DC | PRN

## 2012-04-19 MED ORDER — EPHEDRINE SULFATE 50 MG/ML IJ SOLN
INTRAMUSCULAR | Status: DC | PRN
  Administered 2012-04-19 (×2): 5 mg via INTRAVENOUS

## 2012-04-19 MED ORDER — HYDROMORPHONE HCL PF 1 MG/ML IJ SOLN
INTRAMUSCULAR | Status: AC
  Filled 2012-04-19: qty 1

## 2012-04-19 MED ORDER — PHENOL 1.4 % MT LIQD
1.0000 | OROMUCOSAL | Status: DC | PRN
  Filled 2012-04-19: qty 177

## 2012-04-19 MED ORDER — CEFAZOLIN SODIUM-DEXTROSE 2-3 GM-% IV SOLR
2.0000 g | Freq: Three times a day (TID) | INTRAVENOUS | Status: AC
  Administered 2012-04-19 – 2012-04-20 (×3): 2 g via INTRAVENOUS
  Filled 2012-04-19 (×4): qty 50

## 2012-04-19 MED ORDER — HYDROMORPHONE HCL PF 1 MG/ML IJ SOLN
0.2500 mg | INTRAMUSCULAR | Status: DC | PRN
  Administered 2012-04-19 (×4): 0.5 mg via INTRAVENOUS

## 2012-04-19 MED ORDER — BUPIVACAINE HCL (PF) 0.5 % IJ SOLN
INTRAMUSCULAR | Status: DC | PRN
  Administered 2012-04-19: 20 mL

## 2012-04-19 MED ORDER — PROPOFOL 10 MG/ML IV BOLUS
INTRAVENOUS | Status: DC | PRN
  Administered 2012-04-19: 300 mg via INTRAVENOUS

## 2012-04-19 MED ORDER — ACETAMINOPHEN 325 MG PO TABS: 650.0000 mg | ORAL_TABLET | ORAL | Status: DC | PRN

## 2012-04-19 MED ORDER — HEMOSTATIC AGENTS (NO CHARGE) OPTIME
TOPICAL | Status: DC | PRN
  Administered 2012-04-19: 1 via TOPICAL

## 2012-04-19 MED ORDER — CEFAZOLIN SODIUM 1-5 GM-% IV SOLN: 1.0000 g | Freq: Once | INTRAVENOUS | Status: DC

## 2012-04-19 MED ORDER — PHENYLEPHRINE HCL 10 MG/ML IJ SOLN
INTRAMUSCULAR | Status: DC | PRN
  Administered 2012-04-19: 80 ug via INTRAVENOUS

## 2012-04-19 MED ORDER — LACTATED RINGERS IV SOLN
INTRAVENOUS | Status: DC | PRN
  Administered 2012-04-19 (×2): via INTRAVENOUS

## 2012-04-19 MED ORDER — 0.9 % SODIUM CHLORIDE (POUR BTL) OPTIME
TOPICAL | Status: DC | PRN
  Administered 2012-04-19: 1000 mL

## 2012-04-19 MED ORDER — DEXAMETHASONE SODIUM PHOSPHATE 4 MG/ML IJ SOLN
4.0000 mg | Freq: Four times a day (QID) | INTRAMUSCULAR | Status: AC
  Filled 2012-04-19 (×2): qty 1

## 2012-04-19 MED ORDER — ROCURONIUM BROMIDE 100 MG/10ML IV SOLN
INTRAVENOUS | Status: DC | PRN
  Administered 2012-04-19: 10 mg via INTRAVENOUS
  Administered 2012-04-19: 40 mg via INTRAVENOUS

## 2012-04-19 MED ORDER — HYDROCODONE-ACETAMINOPHEN 5-325 MG PO TABS
1.0000 | ORAL_TABLET | ORAL | Status: DC | PRN
  Administered 2012-04-19 – 2012-04-20 (×5): 2 via ORAL
  Filled 2012-04-19 (×5): qty 2

## 2012-04-19 MED ORDER — FENTANYL CITRATE 0.05 MG/ML IJ SOLN
INTRAMUSCULAR | Status: DC | PRN
  Administered 2012-04-19: 150 ug via INTRAVENOUS

## 2012-04-19 MED ORDER — SODIUM CHLORIDE 0.9 % IV SOLN: 250.0000 mL | INTRAVENOUS | Status: DC

## 2012-04-19 MED ORDER — HYDROMORPHONE HCL PF 1 MG/ML IJ SOLN
1.0000 mg | INTRAMUSCULAR | Status: DC | PRN
  Administered 2012-04-19 (×2): 1 mg via INTRAMUSCULAR
  Filled 2012-04-19 (×3): qty 1

## 2012-04-19 MED ORDER — DEXAMETHASONE 4 MG PO TABS
4.0000 mg | ORAL_TABLET | Freq: Four times a day (QID) | ORAL | Status: AC
  Administered 2012-04-19 (×2): 4 mg via ORAL
  Filled 2012-04-19 (×2): qty 1

## 2012-04-19 MED ORDER — NEOSTIGMINE METHYLSULFATE 1 MG/ML IJ SOLN
INTRAMUSCULAR | Status: DC | PRN
  Administered 2012-04-19: 3 mg via INTRAVENOUS

## 2012-04-19 MED ORDER — KCL IN DEXTROSE-NACL 20-5-0.45 MEQ/L-%-% IV SOLN
80.0000 mL/h | INTRAVENOUS | Status: DC
  Filled 2012-04-19 (×3): qty 1000

## 2012-04-19 MED ORDER — KETOROLAC TROMETHAMINE 30 MG/ML IJ SOLN
30.0000 mg | Freq: Four times a day (QID) | INTRAMUSCULAR | Status: DC
  Administered 2012-04-19 – 2012-04-20 (×3): 30 mg via INTRAVENOUS
  Filled 2012-04-19 (×8): qty 1

## 2012-04-19 MED ORDER — HYDROMORPHONE HCL PF 1 MG/ML IJ SOLN
1.0000 mg | INTRAMUSCULAR | Status: DC | PRN
  Administered 2012-04-19: 1 mg via INTRAVENOUS

## 2012-04-19 MED ORDER — ONDANSETRON HCL 4 MG/2ML IJ SOLN: 4.0000 mg | INTRAMUSCULAR | Status: DC | PRN

## 2012-04-19 SURGICAL SUPPLY — 47 items
BAG DECANTER FOR FLEXI CONT (MISCELLANEOUS) ×2 IMPLANT
BENZOIN TINCTURE PRP APPL 2/3 (GAUZE/BANDAGES/DRESSINGS) ×2 IMPLANT
BLADE SURG ROTATE 9660 (MISCELLANEOUS) ×2 IMPLANT
BNDG COHESIVE 4X5 WHT NS (GAUZE/BANDAGES/DRESSINGS) ×2 IMPLANT
BRUSH SCRUB EZ PLAIN DRY (MISCELLANEOUS) ×2 IMPLANT
BUR CUTTER 7.0 ROUND (BURR) ×2 IMPLANT
BUR MATCHSTICK NEURO 3.0 LAGG (BURR) ×2 IMPLANT
CANISTER SUCTION 2500CC (MISCELLANEOUS) ×2 IMPLANT
CLOTH BEACON ORANGE TIMEOUT ST (SAFETY) ×2 IMPLANT
CONT SPEC 4OZ CLIKSEAL STRL BL (MISCELLANEOUS) ×2 IMPLANT
DERMABOND ADVANCED (GAUZE/BANDAGES/DRESSINGS) ×1
DERMABOND ADVANCED .7 DNX12 (GAUZE/BANDAGES/DRESSINGS) ×1 IMPLANT
DRAPE LAPAROTOMY 100X72X124 (DRAPES) ×2 IMPLANT
DRAPE MICROSCOPE LEICA (MISCELLANEOUS) ×2 IMPLANT
DRAPE MICROSCOPE ZEISS OPMI (DRAPES) IMPLANT
DRAPE SURG 17X23 STRL (DRAPES) ×4 IMPLANT
DRESSING TELFA 8X3 (GAUZE/BANDAGES/DRESSINGS) ×2 IMPLANT
ELECT REM PT RETURN 9FT ADLT (ELECTROSURGICAL) ×2
ELECTRODE REM PT RTRN 9FT ADLT (ELECTROSURGICAL) ×1 IMPLANT
GAUZE SPONGE 4X4 16PLY XRAY LF (GAUZE/BANDAGES/DRESSINGS) IMPLANT
GLOVE BIO SURGEON STRL SZ8 (GLOVE) ×2 IMPLANT
GLOVE ECLIPSE 7.5 STRL STRAW (GLOVE) ×2 IMPLANT
GLOVE INDICATOR 7.0 STRL GRN (GLOVE) ×6 IMPLANT
GLOVE INDICATOR 8.5 STRL (GLOVE) ×2 IMPLANT
GLOVE SS BIOGEL STRL SZ 6.5 (GLOVE) ×2 IMPLANT
GLOVE SUPERSENSE BIOGEL SZ 6.5 (GLOVE) ×2
GOWN BRE IMP SLV AUR LG STRL (GOWN DISPOSABLE) ×2 IMPLANT
GOWN BRE IMP SLV AUR XL STRL (GOWN DISPOSABLE) ×4 IMPLANT
GOWN STRL REIN 2XL LVL4 (GOWN DISPOSABLE) IMPLANT
KIT BASIN OR (CUSTOM PROCEDURE TRAY) ×2 IMPLANT
KIT ROOM TURNOVER OR (KITS) ×2 IMPLANT
NEEDLE HYPO 22GX1.5 SAFETY (NEEDLE) ×2 IMPLANT
NEEDLE SPNL 22GX3.5 QUINCKE BK (NEEDLE) ×2 IMPLANT
NS IRRIG 1000ML POUR BTL (IV SOLUTION) ×2 IMPLANT
PACK LAMINECTOMY NEURO (CUSTOM PROCEDURE TRAY) ×2 IMPLANT
PAD ARMBOARD 7.5X6 YLW CONV (MISCELLANEOUS) ×6 IMPLANT
PATTIES SURGICAL .75X.75 (GAUZE/BANDAGES/DRESSINGS) IMPLANT
RUBBERBAND STERILE (MISCELLANEOUS) ×4 IMPLANT
SPONGE GAUZE 4X4 12PLY (GAUZE/BANDAGES/DRESSINGS) IMPLANT
SPONGE SURGIFOAM ABS GEL SZ50 (HEMOSTASIS) ×2 IMPLANT
STRIP CLOSURE SKIN 1/2X4 (GAUZE/BANDAGES/DRESSINGS) ×2 IMPLANT
SUT VIC AB 2-0 OS6 18 (SUTURE) ×6 IMPLANT
SUT VIC AB 3-0 CP2 18 (SUTURE) ×2 IMPLANT
SYR 20ML ECCENTRIC (SYRINGE) ×2 IMPLANT
TOWEL OR 17X24 6PK STRL BLUE (TOWEL DISPOSABLE) ×2 IMPLANT
TOWEL OR 17X26 10 PK STRL BLUE (TOWEL DISPOSABLE) ×2 IMPLANT
WATER STERILE IRR 1000ML POUR (IV SOLUTION) ×2 IMPLANT

## 2012-04-19 NOTE — Addendum Note (Signed)
Addendum  created 04/30/2012 0931 by Damen Windsor P Patricia Perales, CRNA   Modules edited:Anesthesia Responsible Staff    

## 2012-04-19 NOTE — Preoperative (Signed)
Beta Blockers   Reason not to administer Beta Blockers:Not Applicable 

## 2012-04-19 NOTE — Anesthesia Postprocedure Evaluation (Signed)
  Anesthesia Post-op Note  Patient: Justin Tanner  Procedure(s) Performed: Procedure(s) (LRB) with comments: LUMBAR LAMINECTOMY/DECOMPRESSION MICRODISCECTOMY 1 LEVEL (Right) - Right lumbar four-five microdiskectomy  Patient Location: PACU  Anesthesia Type:General  Level of Consciousness: awake, oriented and patient cooperative  Airway and Oxygen Therapy: Patient Spontanous Breathing  Post-op Pain: mild  Post-op Assessment: Post-op Vital signs reviewed, Patient's Cardiovascular Status Stable, Respiratory Function Stable, Patent Airway, No signs of Nausea or vomiting and Pain level controlled  Post-op Vital Signs: stable  Complications: No apparent anesthesia complications

## 2012-04-19 NOTE — Anesthesia Procedure Notes (Signed)
Procedure Name: Intubation Date/Time: 05/05/12 7:34 AM Performed by: Rogelia Boga Pre-anesthesia Checklist: Patient identified, Emergency Drugs available, Suction available, Patient being monitored and Timeout performed Patient Re-evaluated:Patient Re-evaluated prior to inductionOxygen Delivery Method: Circle system utilized Preoxygenation: Pre-oxygenation with 100% oxygen Intubation Type: IV induction Ventilation: Mask ventilation without difficulty and Oral airway inserted - appropriate to patient size Laryngoscope Size: Mac and 4 Grade View: Grade I Tube type: Oral Tube size: 7.5 mm Number of attempts: 1 Airway Equipment and Method: Stylet Placement Confirmation: ETT inserted through vocal cords under direct vision,  positive ETCO2 and breath sounds checked- equal and bilateral Secured at: 22 cm Tube secured with: Tape Dental Injury: Teeth and Oropharynx as per pre-operative assessment  Comments: Bleeding from left front tooth. Very poor dentition.

## 2012-04-19 NOTE — Addendum Note (Signed)
Addendum  created 04/30/2012 0931 by Rogelia Boga, CRNA   Modules edited:Anesthesia Responsible Staff

## 2012-04-19 NOTE — Transfer of Care (Signed)
Immediate Anesthesia Transfer of Care Note  Patient: Justin Tanner  Procedure(s) Performed: Procedure(s) (LRB) with comments: LUMBAR LAMINECTOMY/DECOMPRESSION MICRODISCECTOMY 1 LEVEL (Right) - Right lumbar four-five microdiskectomy  Patient Location: PACU  Anesthesia Type:General  Level of Consciousness: awake, alert , oriented and patient cooperative  Airway & Oxygen Therapy: Patient Spontanous Breathing and Patient connected to nasal cannula oxygen  Post-op Assessment: Report given to PACU RN, Post -op Vital signs reviewed and stable and Patient moving all extremities X 4  Post vital signs: Reviewed and stable  Complications: No apparent anesthesia complications

## 2012-04-19 NOTE — Op Note (Signed)
Preop diagnosis: Herniated disc L4-5 right with L4 and L5 nerve root compression Postop diagnosis: Same Procedure: Right L4-5 microdiscectomy for decompression of L4 and L5 nerve roots Surgeon: Theo Reither Assistant: Pool  After being placed in the prone position the patient's back was prepped and draped in usual sterile fashion. Previous incision was opened up and using finger dissection was carried down to the previous laminotomy. Blood clot within the wound was evacuated without difficulty and self-retaining tract was placed for exposure. We dissected some early scar and dried blood clot off of the thecal sac and were able to visualize the L4 and L5 nerve roots as well as the thecal sac. The microscope was brought into the field and used for the remainder of the case. Using microdissection technique the lateral aspect of the thecal sac and L5 nerve root were identified. Further coagulation was carried out down before the canal to identify the L45 disc. The annulus was coagulated and incised a 15 blade and the disc space thoroughly cleaned out. We then inspected superiorly with a superior fragment was identified and was removed without difficulty in a piecemeal fashion. At this time the L. IV nerve root was found to be well visualized well decompressed as was the L5 nerve root and thecal sac. We inspected once more for any evidence of residual compression and none could be identified. Large amounts of irrigation were carried out and any bleeding control proper coagulation and Gelfoam. The was then closed in multiple layers of Vicryl on the muscle fascia subcutaneous and subcuticular tissues. Dermabond and Steri-Strips were placed on the skin. A sterile dressing was then applied and the patient was extubated and taken to recovery room in stable condition.

## 2012-04-19 NOTE — Progress Notes (Signed)
Patient ID: Justin Tanner, male   DOB: 1960/05/10, 53 y.o.   MRN: [id number] Patient stable over the weekend. He is ready for surgery. His questions have been answered. Anaesthesia aware of previous issues. Ready to proceed with remainder of case.

## 2012-04-19 NOTE — Anesthesia Preprocedure Evaluation (Addendum)
Anesthesia Evaluation  Patient identified by MRN, date of birth, ID band Patient awake    Reviewed: Allergy & Precautions, H&P , NPO status , Patient's Chart, lab work & pertinent test results  Airway Mallampati: II TM Distance: >3 FB Neck ROM: full    Dental  (+) Poor Dentition and Dental Advisory Given   Pulmonary          Cardiovascular hypertension, Pt. on medications and Pt. on home beta blockers + CAD + dysrhythmias Ventricular Tachycardia     Neuro/Psych    GI/Hepatic   Endo/Other    Renal/GU      Musculoskeletal   Abdominal   Peds  Hematology   Anesthesia Other Findings   Reproductive/Obstetrics                         Anesthesia Physical Anesthesia Plan  ASA: II  Anesthesia Plan: General   Post-op Pain Management:    Induction: Intravenous  Airway Management Planned: Oral ETT  Additional Equipment:   Intra-op Plan:   Post-operative Plan: Extubation in OR  Informed Consent: I have reviewed the patients History and Physical, chart, labs and discussed the procedure including the risks, benefits and alternatives for the proposed anesthesia with the patient or authorized representative who has indicated his/her understanding and acceptance.     Plan Discussed with: CRNA, Anesthesiologist and Surgeon  Anesthesia Plan Comments:         Anesthesia Quick Evaluation

## 2012-04-20 MED ORDER — CYCLOBENZAPRINE HCL 10 MG PO TABS: 10.0000 mg | ORAL_TABLET | Freq: Three times a day (TID) | ORAL | Status: DC | PRN

## 2012-04-20 MED ORDER — OXYCODONE-ACETAMINOPHEN 5-325 MG PO TABS
1.0000 | ORAL_TABLET | ORAL | Status: DC | PRN
  Administered 2012-04-20 (×2): 2 via ORAL
  Filled 2012-04-20 (×2): qty 2

## 2012-04-20 MED ORDER — OXYCODONE-ACETAMINOPHEN 10-325 MG PO TABS: 1.0000 | ORAL_TABLET | ORAL | Status: DC | PRN

## 2012-04-20 NOTE — Plan of Care (Signed)
Problem: Consults Goal: Diagnosis - Spinal Surgery Outcome: Completed/Met Microdiscectomy

## 2012-04-20 NOTE — Progress Notes (Signed)
Reviewed discharge instructions with patient and wife, they stated their understanding.  Patient states Dr. Gerlene Fee stated to followup with him next Wenesday, patient to call and make the appointment.  Patient discharged via wheelchair home with wife, states MD stated he did not need back brace.  Colman Cater

## 2012-04-20 NOTE — Plan of Care (Signed)
Problem: Consults Goal: Diagnosis - Spinal Surgery Outcome: Completed/Met Date Met:  04/20/12 Microdiscectomy/L 4-5 discectomy

## 2012-04-20 NOTE — Discharge Summary (Signed)
  Physician Discharge Summary  Patient ID: Justin Tanner MRN: [id number] DOB/AGE: 1960/07/10 52 y.o.  Admit date: 04/13/2012 Discharge date: 04/20/2012  Admission Diagnoses:  Discharge Diagnoses:  Principal Problem:  *Sudden cardiac arrest Active Problems:  Sustained VT (ventricular tachycardia)  Hypertension  Respiratory failure  Preoperative respiratory examination  Coronary atherosclerosis of native coronary artery   Discharged Condition: good  Hospital Course: Admitted emergently after cardiac arrest during routine lumbar surgery. Had cardiac work up that was negative for significant CAD. Cleared by cardiology for surgery, and underwent remainder of lumbar discectomy yesterday. Did fine. Now stable from cardiac stand point. Pain in leg markedly improved. Ready for d/c. Specific instructions given.  Consults: cardiology  Significant Diagnostic Studies: angiography: Cardiac cath  Treatments: surgery: L 45 discectomy  Discharge Exam: Blood pressure 136/86, pulse 79, temperature 97.8 F (36.6 C), temperature source Oral, resp. rate 20, height 5\' 9"  (1.753 m), weight 95.4 kg (210 lb 5.1 oz), SpO2 94.00%. Incision/Wound:clean and dry  Disposition: 01-Home or Self Care     Medication List     As of 04/20/2012  9:30 AM    ASK your doctor about these medications         citalopram 40 MG tablet   Commonly known as: CELEXA   Take 40 mg by mouth daily.      cyclobenzaprine 10 MG tablet   Commonly known as: FLEXERIL   Take 10 mg by mouth 2 (two) times daily as needed. For muscle spasms.      GARLIC PO   Take 1 capsule by mouth daily with lunch.      lisinopril-hydrochlorothiazide 10-12.5 MG per tablet   Commonly known as: PRINZIDE,ZESTORETIC   Take 1 tablet by mouth daily.      metoprolol succinate 100 MG 24 hr tablet   Commonly known as: TOPROL-XL   Take 100 mg by mouth daily. Take with or immediately following a meal.      oxyCODONE-acetaminophen 5-325 MG per  tablet   Commonly known as: PERCOCET/ROXICET   Take 2 tablets by mouth every 6 (six) hours as needed for pain.      pravastatin 40 MG tablet   Commonly known as: PRAVACHOL   Take 40 mg by mouth at bedtime.      VITAMIN B COMPLEX PO   Take 1 tablet by mouth daily with lunch.      vitamin C 500 MG tablet   Commonly known as: ASCORBIC ACID   Take 500 mg by mouth daily with lunch.         At home rest most of the time. Get up 9 or 10 times each day and take a 15 or 20 minute walk. No riding in the car and to your first postoperative appointment. If you have neck surgery you may shower from the chest down starting on the third postoperative day. If you had back surgery he may start showering on the third postoperative day with saran wrap wrapped around your incisional area 3 times. After the shower remove the saran wrap. Take pain medicine as needed and other medications as instructed. Call my office for an appointment.  SignedReinaldo Meeker, MD 04/20/2012, 9:30 AM

## 2012-04-21 ENCOUNTER — Encounter (HOSPITAL_COMMUNITY): Payer: Self-pay | Admitting: Neurosurgery

## 2012-05-15 DEATH — deceased

## 2012-05-31 ENCOUNTER — Encounter: Payer: Self-pay | Admitting: Internal Medicine
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
## 2014-02-23 ENCOUNTER — Encounter (HOSPITAL_COMMUNITY): Payer: Self-pay | Admitting: Cardiology

## 2014-10-09 DIAGNOSIS — S60410A Abrasion of right index finger, initial encounter: Secondary | ICD-10-CM | POA: Diagnosis not present

## 2014-10-09 DIAGNOSIS — S61212A Laceration without foreign body of right middle finger without damage to nail, initial encounter: Secondary | ICD-10-CM | POA: Diagnosis not present

## 2014-10-09 DIAGNOSIS — S62662A Nondisplaced fracture of distal phalanx of right middle finger, initial encounter for closed fracture: Secondary | ICD-10-CM | POA: Diagnosis not present

## 2015-01-22 DIAGNOSIS — E785 Hyperlipidemia, unspecified: Secondary | ICD-10-CM | POA: Diagnosis not present

## 2015-01-22 DIAGNOSIS — I10 Essential (primary) hypertension: Secondary | ICD-10-CM | POA: Diagnosis not present

## 2015-01-22 DIAGNOSIS — G8929 Other chronic pain: Secondary | ICD-10-CM | POA: Diagnosis not present

## 2015-01-22 DIAGNOSIS — F329 Major depressive disorder, single episode, unspecified: Secondary | ICD-10-CM | POA: Diagnosis not present

## 2015-01-22 DIAGNOSIS — Z6831 Body mass index (BMI) 31.0-31.9, adult: Secondary | ICD-10-CM | POA: Diagnosis not present

## 2015-01-22 DIAGNOSIS — Z23 Encounter for immunization: Secondary | ICD-10-CM | POA: Diagnosis not present

## 2015-01-22 DIAGNOSIS — R799 Abnormal finding of blood chemistry, unspecified: Secondary | ICD-10-CM | POA: Diagnosis not present

## 2015-02-21 DIAGNOSIS — Z6832 Body mass index (BMI) 32.0-32.9, adult: Secondary | ICD-10-CM | POA: Diagnosis not present

## 2015-02-21 DIAGNOSIS — I10 Essential (primary) hypertension: Secondary | ICD-10-CM | POA: Diagnosis not present

## 2015-02-21 DIAGNOSIS — G8929 Other chronic pain: Secondary | ICD-10-CM | POA: Diagnosis not present

## 2015-05-03 DIAGNOSIS — G8929 Other chronic pain: Secondary | ICD-10-CM | POA: Diagnosis not present

## 2015-05-03 DIAGNOSIS — M961 Postlaminectomy syndrome, not elsewhere classified: Secondary | ICD-10-CM | POA: Diagnosis not present

## 2015-05-03 DIAGNOSIS — M545 Low back pain: Secondary | ICD-10-CM | POA: Diagnosis not present

## 2015-05-10 DIAGNOSIS — J019 Acute sinusitis, unspecified: Secondary | ICD-10-CM | POA: Diagnosis not present

## 2015-05-10 DIAGNOSIS — E785 Hyperlipidemia, unspecified: Secondary | ICD-10-CM | POA: Diagnosis not present

## 2015-05-10 DIAGNOSIS — I10 Essential (primary) hypertension: Secondary | ICD-10-CM | POA: Diagnosis not present

## 2015-05-10 DIAGNOSIS — Z6833 Body mass index (BMI) 33.0-33.9, adult: Secondary | ICD-10-CM | POA: Diagnosis not present

## 2015-08-07 DIAGNOSIS — I10 Essential (primary) hypertension: Secondary | ICD-10-CM | POA: Diagnosis not present

## 2015-08-07 DIAGNOSIS — Z6832 Body mass index (BMI) 32.0-32.9, adult: Secondary | ICD-10-CM | POA: Diagnosis not present

## 2015-08-07 DIAGNOSIS — F329 Major depressive disorder, single episode, unspecified: Secondary | ICD-10-CM | POA: Diagnosis not present

## 2015-08-07 DIAGNOSIS — G8929 Other chronic pain: Secondary | ICD-10-CM | POA: Diagnosis not present

## 2015-08-07 DIAGNOSIS — E785 Hyperlipidemia, unspecified: Secondary | ICD-10-CM | POA: Diagnosis not present

## 2015-08-07 DIAGNOSIS — E669 Obesity, unspecified: Secondary | ICD-10-CM | POA: Diagnosis not present

## 2015-09-10 DIAGNOSIS — S299XXA Unspecified injury of thorax, initial encounter: Secondary | ICD-10-CM | POA: Diagnosis not present

## 2015-09-13 DIAGNOSIS — I10 Essential (primary) hypertension: Secondary | ICD-10-CM | POA: Diagnosis not present

## 2015-09-13 DIAGNOSIS — Z9181 History of falling: Secondary | ICD-10-CM | POA: Diagnosis not present

## 2015-09-13 DIAGNOSIS — E785 Hyperlipidemia, unspecified: Secondary | ICD-10-CM | POA: Diagnosis not present

## 2015-09-13 DIAGNOSIS — R042 Hemoptysis: Secondary | ICD-10-CM | POA: Diagnosis not present

## 2015-09-13 DIAGNOSIS — F329 Major depressive disorder, single episode, unspecified: Secondary | ICD-10-CM | POA: Diagnosis not present

## 2015-09-13 DIAGNOSIS — R0781 Pleurodynia: Secondary | ICD-10-CM | POA: Diagnosis not present

## 2015-09-13 DIAGNOSIS — E669 Obesity, unspecified: Secondary | ICD-10-CM | POA: Diagnosis not present

## 2015-09-13 DIAGNOSIS — M549 Dorsalgia, unspecified: Secondary | ICD-10-CM | POA: Diagnosis not present

## 2015-09-14 DIAGNOSIS — R0781 Pleurodynia: Secondary | ICD-10-CM | POA: Diagnosis not present

## 2015-09-14 DIAGNOSIS — S299XXA Unspecified injury of thorax, initial encounter: Secondary | ICD-10-CM | POA: Diagnosis not present

## 2015-11-20 DIAGNOSIS — G8929 Other chronic pain: Secondary | ICD-10-CM | POA: Diagnosis not present

## 2015-11-20 DIAGNOSIS — E669 Obesity, unspecified: Secondary | ICD-10-CM | POA: Diagnosis not present

## 2015-11-20 DIAGNOSIS — Z6832 Body mass index (BMI) 32.0-32.9, adult: Secondary | ICD-10-CM | POA: Diagnosis not present

## 2015-11-20 DIAGNOSIS — G2581 Restless legs syndrome: Secondary | ICD-10-CM | POA: Diagnosis not present

## 2015-11-20 DIAGNOSIS — Z1389 Encounter for screening for other disorder: Secondary | ICD-10-CM | POA: Diagnosis not present

## 2015-12-20 DIAGNOSIS — G8929 Other chronic pain: Secondary | ICD-10-CM | POA: Diagnosis not present

## 2015-12-20 DIAGNOSIS — E669 Obesity, unspecified: Secondary | ICD-10-CM | POA: Diagnosis not present

## 2015-12-20 DIAGNOSIS — Z1389 Encounter for screening for other disorder: Secondary | ICD-10-CM | POA: Diagnosis not present

## 2015-12-20 DIAGNOSIS — Z23 Encounter for immunization: Secondary | ICD-10-CM | POA: Diagnosis not present

## 2015-12-20 DIAGNOSIS — Z6831 Body mass index (BMI) 31.0-31.9, adult: Secondary | ICD-10-CM | POA: Diagnosis not present

## 2015-12-20 DIAGNOSIS — G2581 Restless legs syndrome: Secondary | ICD-10-CM | POA: Diagnosis not present

## 2016-06-17 DIAGNOSIS — F329 Major depressive disorder, single episode, unspecified: Secondary | ICD-10-CM | POA: Diagnosis not present

## 2016-06-17 DIAGNOSIS — Z6833 Body mass index (BMI) 33.0-33.9, adult: Secondary | ICD-10-CM | POA: Diagnosis not present

## 2016-06-17 DIAGNOSIS — R7303 Prediabetes: Secondary | ICD-10-CM | POA: Diagnosis not present

## 2016-06-17 DIAGNOSIS — I10 Essential (primary) hypertension: Secondary | ICD-10-CM | POA: Diagnosis not present

## 2016-06-17 DIAGNOSIS — E669 Obesity, unspecified: Secondary | ICD-10-CM | POA: Diagnosis not present

## 2016-06-17 DIAGNOSIS — E785 Hyperlipidemia, unspecified: Secondary | ICD-10-CM | POA: Diagnosis not present

## 2016-06-17 DIAGNOSIS — G8929 Other chronic pain: Secondary | ICD-10-CM | POA: Diagnosis not present

## 2016-07-16 DIAGNOSIS — E669 Obesity, unspecified: Secondary | ICD-10-CM | POA: Diagnosis not present

## 2016-07-16 DIAGNOSIS — G8929 Other chronic pain: Secondary | ICD-10-CM | POA: Diagnosis not present

## 2016-07-16 DIAGNOSIS — Z6832 Body mass index (BMI) 32.0-32.9, adult: Secondary | ICD-10-CM | POA: Diagnosis not present

## 2016-07-16 DIAGNOSIS — I10 Essential (primary) hypertension: Secondary | ICD-10-CM | POA: Diagnosis not present

## 2016-11-27 DIAGNOSIS — I4891 Unspecified atrial fibrillation: Secondary | ICD-10-CM | POA: Diagnosis not present

## 2016-11-27 DIAGNOSIS — F329 Major depressive disorder, single episode, unspecified: Secondary | ICD-10-CM | POA: Diagnosis not present

## 2016-11-27 DIAGNOSIS — I499 Cardiac arrhythmia, unspecified: Secondary | ICD-10-CM | POA: Diagnosis not present

## 2016-11-27 DIAGNOSIS — R7303 Prediabetes: Secondary | ICD-10-CM | POA: Diagnosis not present

## 2016-11-27 DIAGNOSIS — I10 Essential (primary) hypertension: Secondary | ICD-10-CM | POA: Diagnosis not present

## 2016-11-27 DIAGNOSIS — R221 Localized swelling, mass and lump, neck: Secondary | ICD-10-CM | POA: Diagnosis not present

## 2016-11-27 DIAGNOSIS — Z1389 Encounter for screening for other disorder: Secondary | ICD-10-CM | POA: Diagnosis not present

## 2016-11-27 DIAGNOSIS — G2581 Restless legs syndrome: Secondary | ICD-10-CM | POA: Diagnosis not present

## 2016-11-27 DIAGNOSIS — E785 Hyperlipidemia, unspecified: Secondary | ICD-10-CM | POA: Diagnosis not present

## 2016-12-01 DIAGNOSIS — R221 Localized swelling, mass and lump, neck: Secondary | ICD-10-CM | POA: Diagnosis not present

## 2016-12-01 DIAGNOSIS — E041 Nontoxic single thyroid nodule: Secondary | ICD-10-CM | POA: Diagnosis not present

## 2016-12-09 ENCOUNTER — Ambulatory Visit: Payer: Self-pay | Admitting: Cardiology

## 2016-12-15 DIAGNOSIS — J349 Unspecified disorder of nose and nasal sinuses: Secondary | ICD-10-CM | POA: Diagnosis not present

## 2016-12-15 DIAGNOSIS — R591 Generalized enlarged lymph nodes: Secondary | ICD-10-CM | POA: Diagnosis not present

## 2016-12-15 DIAGNOSIS — R221 Localized swelling, mass and lump, neck: Secondary | ICD-10-CM | POA: Diagnosis not present

## 2016-12-15 DIAGNOSIS — E041 Nontoxic single thyroid nodule: Secondary | ICD-10-CM | POA: Diagnosis not present

## 2016-12-15 DIAGNOSIS — K219 Gastro-esophageal reflux disease without esophagitis: Secondary | ICD-10-CM | POA: Diagnosis not present

## 2016-12-17 ENCOUNTER — Encounter: Payer: Self-pay | Admitting: Cardiology

## 2016-12-17 ENCOUNTER — Ambulatory Visit (HOSPITAL_BASED_OUTPATIENT_CLINIC_OR_DEPARTMENT_OTHER)
Admission: RE | Admit: 2016-12-17 | Discharge: 2016-12-17 | Disposition: A | Discharge status: Home / Self Care | Payer: Medicare Other | Source: Ambulatory Visit | Attending: Cardiology | Admitting: Cardiology

## 2016-12-17 ENCOUNTER — Ambulatory Visit (INDEPENDENT_AMBULATORY_CARE_PROVIDER_SITE_OTHER): Payer: Medicare Other | Admitting: Cardiology

## 2016-12-17 VITALS — BP 102/64 | HR 94 | Ht 69.0 in | Wt 222.0 lb

## 2016-12-17 DIAGNOSIS — R0602 Shortness of breath: Secondary | ICD-10-CM | POA: Diagnosis not present

## 2016-12-17 DIAGNOSIS — I700 Atherosclerosis of aorta: Secondary | ICD-10-CM | POA: Diagnosis not present

## 2016-12-17 DIAGNOSIS — I209 Angina pectoris, unspecified: Secondary | ICD-10-CM | POA: Insufficient documentation

## 2016-12-17 DIAGNOSIS — I10 Essential (primary) hypertension: Secondary | ICD-10-CM

## 2016-12-17 DIAGNOSIS — I469 Cardiac arrest, cause unspecified: Secondary | ICD-10-CM | POA: Diagnosis not present

## 2016-12-17 DIAGNOSIS — R079 Chest pain, unspecified: Secondary | ICD-10-CM | POA: Diagnosis not present

## 2016-12-17 DIAGNOSIS — E782 Mixed hyperlipidemia: Secondary | ICD-10-CM | POA: Insufficient documentation

## 2016-12-17 DIAGNOSIS — I4891 Unspecified atrial fibrillation: Secondary | ICD-10-CM | POA: Insufficient documentation

## 2016-12-17 MED ORDER — NITROGLYCERIN 0.4 MG SL SUBL
0.4000 mg | SUBLINGUAL_TABLET | SUBLINGUAL | 6 refills | Status: AC | PRN
Start: 2016-12-17 — End: 2017-03-17

## 2016-12-17 NOTE — Progress Notes (Signed)
Cardiology Office Note:    Date:  12/17/2016   ID:  Justin Tanner, DOB 02/09/1961, MRN [id number]  PCP:  Cole, Dawn Watkins, NP  Cardiologist:  Temesha Queener R Tajae Rybicki, MD   Referring MD: No ref. provider found    ASSESSMENT:    1. Essential hypertension   2. Sudden cardiac arrest (HCC)   3. Angina pectoris (HCC)   4. Mixed dyslipidemia    PLAN:    In order of problems listed above:  1. I'm concerned about the patient's symptoms. They are suggestive of angina and he has multiple risk factors for coronary artery disease. In view of the patient's symptoms, I discussed with the patient options for evaluation. Invasive and noninvasive options were given to the patient. I discussed stress testing and coronary angiography and left heart catheterization at length. Benefits, pros and cons of each approach were discussed at length. Patient had multiple questions which were answered to the patient's satisfaction. Patient opted for invasive evaluation and we will set up for coronary angiography and left heart catheterization. Further recommendations will be made based on the findings with coronary angiography. In the interim if the patient has any significant symptoms in hospital to the nearest emergency room. 2. His blood pressure stable. 3. I discussed with the patient atrial fibrillation, disease process. Management and therapy including rate and rhythm control, anticoagulation benefits and potential risks were discussed extensively with the patient. Patient had multiple questions which were answered to patient's satisfaction. 4. Patient's chads score is 1 and he will not need long-term anticoagulation. His coronary angiography is scheduled for the afternoon tomorrow which is a finding suggestive asked him to stop his anticoagulation. However after the coronary angiography would like him to be started on 20 mg of xarelto so that when he comes back to see me if he is asymptomatic with his  anticoagulation and would like to consider cardioversion. Eventually I will put him on 325 mg of coated aspirin. 5. Diet was discussed with dyslipidemia and verbalized understanding. He will be seen in follow-up appointment with coronary angiography.patient and his wife had multiple questions which were answered to their satisfaction. Total time for this evaluation was 60 minutes  Medication Adjustments/Labs and Tests Ordered: Current medicines are reviewed at length with the patient today.  Concerns regarding medicines are outlined above.  No orders of the defined types were placed in this encounter.  No orders of the defined types were placed in this encounter.    History of Present Illness:    Justin Tanner is a 56 y.o. male who is being seen today for the evaluation of chest pain suggesting angina at the request of his primary care provider. Patient is a pleasant 56-year-old male. He has past medical history of essential hypertension and dyslipidemia. He mentions to me that over the past several weeks when he exerts himself or findings and a stress substernal chest tightness. He went to the neck and to the arm. This of concern to him. No orthopnea or PND. He leads a sedentary lifestyle. He's had a cardiac arrest in the remote past. The time of surgery and anesthesia but he subsequently underwent a complete evaluation for this including coronary angiography and he was told that it was fine. This was many years ago. No details are available at this time.at the time of my evaluation is alert awake oriented and in no distress  Past Medical History:  Diagnosis Date  . Arrhythmia   . HLD (hyperlipidemia)   .   Hypertension   . Obesity     Past Surgical History:  Procedure Laterality Date  . LEFT HEART CATHETERIZATION WITH CORONARY ANGIOGRAM N/A 03/25/2012   Procedure: LEFT HEART CATHETERIZATION WITH CORONARY ANGIOGRAM;  Surgeon: Peter M Jordan, MD;  Location: [facility] CATH LAB;  Service:  Cardiovascular;  Laterality: N/A;  . LUMBAR LAMINECTOMY/DECOMPRESSION MICRODISCECTOMY  05/01/2012   Procedure: LUMBAR LAMINECTOMY/DECOMPRESSION MICRODISCECTOMY 1 LEVEL;  Surgeon: Randy O Kritzer, MD;  Location: [facility] NEURO ORS;  Service: Neurosurgery;  Laterality: Right;  Right lumbar four-five microdiskectomy  . NECK SURGERY      Current Medications: Current Meds  Medication Sig  . B Complex Vitamins (VITAMIN B COMPLEX PO) Take 1 tablet by mouth daily with lunch.  . citalopram (CELEXA) 40 MG tablet Take 40 mg by mouth daily.  . gabapentin (NEURONTIN) 600 MG tablet Take 600 mg by mouth 3 (three) times daily.  . gemfibrozil (LOPID) 600 MG tablet Take 600 mg by mouth 2 (two) times daily.  . lisinopril-hydrochlorothiazide (PRINZIDE,ZESTORETIC) 10-12.5 MG per tablet Take 1 tablet by mouth daily.  . metoprolol succinate (TOPROL-XL) 100 MG 24 hr tablet Take 100 mg by mouth daily. Take with or immediately following a meal.  . pramipexole (MIRAPEX) 0.25 MG tablet Take 0.25 mg by mouth daily as needed.  . pravastatin (PRAVACHOL) 40 MG tablet Take 40 mg by mouth at bedtime.  . vitamin C (ASCORBIC ACID) 500 MG tablet Take 500 mg by mouth daily with lunch.   . XARELTO 15 MG TABS tablet Take 20 mg by mouth daily. Start this new dose once given the okay after the heart catheterization     Allergies:   Patient has no known allergies.   Social History   Social History  . Marital status: Single    Spouse name: N/A  . Number of children: N/A  . Years of education: N/A   Occupational History  . Ironworker - heavy work    Social History Main Topics  . Smoking status: Former Smoker  . Smokeless tobacco: Former User  . Alcohol use No     Comment: Former ETOH use - up to 1 6pk per day.    . Drug use: No  . Sexual activity: Not Asked   Other Topics Concern  . None   Social History Narrative   Lives with wife, no tobacco, borderline DM     Family History: The patient's family history includes  Heart disease in his father and mother; Hypertension in his father.  ROS:   Please see the history of present illness.    All other systems reviewed and are negative.  EKGs/Labs/Other Studies Reviewed:    The following studies were reviewed today: I reviewed her primary care office notes, lab work and EKGs extensively and discussed the findings with the patient.   Recent Labs: No results found for requested labs within last 8760 hours.  Recent Lipid Panel No results found for: CHOL, TRIG, HDL, CHOLHDL, VLDL, LDLCALC, LDLDIRECT  Physical Exam:    VS:  BP 102/64   Pulse 94   Ht 5' 9" (1.753 m)   Wt 222 lb 0.6 oz (100.7 kg)   SpO2 98%   BMI 32.79 kg/m     Wt Readings from Last 3 Encounters:  12/17/16 222 lb 0.6 oz (100.7 kg)  05/02/2012 210 lb 5.1 oz (95.4 kg)     GEN: Patient is in no acute distress HEENT: Normal NECK: No JVD; No carotid bruits LYMPHATICS: No lymphadenopathy CARDIAC: S1 S2 regular,   2/6 systolic murmur at the apex. RESPIRATORY:  Clear to auscultation without rales, wheezing or rhonchi  ABDOMEN: Soft, non-tender, non-distended MUSCULOSKELETAL:  No edema; No deformity  SKIN: Warm and dry NEUROLOGIC:  Alert and oriented x 3 PSYCHIATRIC:  Normal affect    Signed, Sofie Schendel R Lola Czerwonka, MD  12/17/2016 9:49 AM    [facility]   

## 2016-12-17 NOTE — Patient Instructions (Addendum)
Medication Instructions:  Your physician has recommended you make the following change in your medication: CHANGE Xarelto dose changed from  to ; start the new dose once told okay after the heart catheterization.  DO NOT TAKE XARELTO UNTIL TOLD TO DO SO AFTER THE HEART CATHETERIZATION  Labwork: Your physician recommends that you need: BMP, CBC, and PT/INR.  Testing/Procedures: A chest x-ray takes a picture of the organs and structures inside the chest, including the heart, lungs, and blood vessels. This test can show several things, including, whether the heart is enlarges; whether fluid is building up in the lungs; and whether pacemaker / defibrillator leads are still in place.  Your physician has requested that you have a cardiac catheterization. Cardiac catheterization is used to diagnose and/or treat various heart conditions. Doctors may recommend this procedure for a number of different reasons. The most common reason is to evaluate chest pain. Chest pain can be a symptom of coronary artery disease (CAD), and cardiac catheterization can show whether plaque is narrowing or blocking your heart's arteries. This procedure is also used to evaluate the valves, as well as measure the blood flow and oxygen levels in different parts of your heart. For further information please visit [email or web address] Please follow instruction sheet, as given.  Your physician has requested that you have an echocardiogram. Echocardiography is a painless test that uses sound waves to create images of your heart. It provides your doctor with information about the size and shape of your heart and how well your heart's chambers and valves are working. This procedure takes approximately one hour. There are no restrictions for this procedure.     Follow-Up: 2-3 weeks dependent upon cath  Any Other Special Instructions Will Be Listed Below (If Applicable).     If you need a refill on your cardiac medications  before your next appointment, please call your pharmacy.   Coronary Angiogram With Stent Coronary angiogram with stent placement is a procedure to widen or open a narrow blood vessel of the heart (coronary artery). Arteries may become blocked by cholesterol buildup (plaques) in the lining or wall. When a coronary artery becomes partially blocked, blood flow to that area decreases. This may lead to chest pain or a heart attack (myocardial infarction). A stent is a small piece of metal that looks like mesh or a spring. Stent placement may be done as treatment for a heart attack or right after a coronary angiogram in which a blocked artery is found. Let your health care provider know about:  Any allergies you have.  All medicines you are taking, including vitamins, herbs, eye drops, creams, and over-the-counter medicines.  Any problems you or family members have had with anesthetic medicines.  Any blood disorders you have.  Any surgeries you have had.  Any medical conditions you have.  Whether you are pregnant or may be pregnant. What are the risks? Generally, this is a safe procedure. However, problems may occur, including:  Damage to the heart or its blood vessels.  A return of blockage.  Bleeding, infection, or bruising at the insertion site.  A collection of blood under the skin (hematoma) at the insertion site.  A blood clot in another part of the body.  Kidney injury.  Allergic reaction to the dye or contrast that is used.  Bleeding into the abdomen (retroperitoneal bleeding).  What happens before the procedure? Staying hydrated Follow instructions from your health care provider about hydration, which may include:  Up to 2 hours before  the procedure - you may continue to drink clear liquids, such as water, clear fruit juice, black coffee, and plain tea.  Eating and drinking restrictions Follow instructions from your health care provider about eating and drinking,  which may include:  8 hours before the procedure - stop eating heavy meals or foods such as meat, fried foods, or fatty foods.  6 hours before the procedure - stop eating light meals or foods, such as toast or cereal.  2 hours before the procedure - stop drinking clear liquids.  Ask your health care provider about:  Changing or stopping your regular medicines. This is especially important if you are taking diabetes medicines or blood thinners.  Taking medicines such as ibuprofen. These medicines can thin your blood. Do not take these medicines before your procedure if your health care provider instructs you not to. Generally, aspirin is recommended before a procedure of passing a small, thin tube (catheter) through a blood vessel and into the heart (cardiac catheterization).  What happens during the procedure?  An IV tube will be inserted into one of your veins.  You will be given one or more of the following: ? A medicine to help you relax (sedative). ? A medicine to numb the area where the catheter will be inserted into an artery (local anesthetic).  To reduce your risk of infection: ? Your health care team will wash or sanitize their hands. ? Your skin will be washed with soap. ? Hair may be removed from the area where the catheter will be inserted.  Using a guide wire, the catheter will be inserted into an artery. The location may be in your groin, in your wrist, or in the fold of your arm (near your elbow).  A type of X-ray (fluoroscopy) will be used to help guide the catheter to the opening of the arteries in the heart.  A dye will be injected into the catheter, and X-rays will be taken. The dye will help to show where any narrowing or blockages are located in the arteries.  A tiny wire will be guided to the blocked spot, and a balloon will be inflated to make the artery wider.  The stent will be expanded and will crush the plaques into the wall of the vessel. The stent will  hold the area open and improve the blood flow. Most stents have a drug coating to reduce the risk of the stent narrowing over time.  The artery may be made wider using a drill, laser, or other tools to remove plaques.  When the blood flow is better, the catheter will be removed. The lining of the artery will grow over the stent, which stays where it was placed. This procedure may vary among health care providers and hospitals. What happens after the procedure?  If the procedure is done through the leg, you will be kept in bed lying flat for about 6 hours. You will be instructed to not bend and not cross your legs.  The insertion site will be checked frequently.  The pulse in your foot or wrist will be checked frequently.  You may have additional blood tests, X-rays, and a test that records the electrical activity of your heart (electrocardiogram, or ECG). This information is not intended to replace advice given to you by your health care provider. Make sure you discuss any questions you have with your health care provider. Document Released: 09/07/2002 Document Revised: 11/01/2015 Document Reviewed: 10/07/2015 Elsevier Interactive Patient Education  2017 [employer]  Inc.  

## 2016-12-18 ENCOUNTER — Telehealth: Payer: Self-pay

## 2016-12-18 LAB — BASIC METABOLIC PANEL
BUN / CREAT RATIO: 19 (ref 9–20)
BUN: 27 mg/dL — AB (ref 6–24)
CO2: 20 mmol/L (ref 20–29)
Calcium: 9.4 mg/dL (ref 8.7–10.2)
Chloride: 104 mmol/L (ref 96–106)
Creatinine, Ser: 1.42 mg/dL — ABNORMAL HIGH (ref 0.76–1.27)
GFR, EST AFRICAN AMERICAN: 63 mL/min/{1.73_m2} (ref >59)
GFR, EST NON AFRICAN AMERICAN: 55 mL/min/{1.73_m2} — AB (ref >59)
Glucose: 114 mg/dL — ABNORMAL HIGH (ref 65–99)
Potassium: 4.6 mmol/L (ref 3.5–5.2)
Sodium: 141 mmol/L (ref 134–144)

## 2016-12-18 LAB — CBC WITH DIFFERENTIAL
BASOS: 1 %
Basophils Absolute: 0.1 10*3/uL (ref 0.0–0.2)
EOS (ABSOLUTE): 0.3 10*3/uL (ref 0.0–0.4)
EOS: 2 %
HEMATOCRIT: 42.5 % (ref 37.5–51.0)
HEMOGLOBIN: 14 g/dL (ref 13.0–17.7)
Immature Grans (Abs): 0 10*3/uL (ref 0.0–0.1)
Immature Granulocytes: 0 %
LYMPHS ABS: 5.2 10*3/uL — AB (ref 0.7–3.1)
Lymphs: 41 %
MCH: 27.4 pg (ref 26.6–33.0)
MCHC: 32.9 g/dL (ref 31.5–35.7)
MCV: 83 fL (ref 79–97)
MONOCYTES: 7 %
MONOS ABS: 0.9 10*3/uL (ref 0.1–0.9)
NEUTROS ABS: 6.1 10*3/uL (ref 1.4–7.0)
Neutrophils: 49 %
RBC: 5.11 x10E6/uL (ref 4.14–5.80)
RDW: 14.6 % (ref 12.3–15.4)
WBC: 12.6 10*3/uL — ABNORMAL HIGH (ref 3.4–10.8)

## 2016-12-18 LAB — PROTIME-INR
INR: 1 (ref 0.8–1.2)
PROTHROMBIN TIME: 10.5 s (ref 9.1–12.0)

## 2016-12-18 NOTE — Telephone Encounter (Signed)
Patient contacted pre-catheterization at [facility] scheduled for:  12/19/2016 @ 1200 Verified arrival time and place:  NT @ 1000 Confirmed AM meds to be taken pre-cath with sip of water: Take ASA  Hold lisin/HCTZ-wife states unsure if he held this Bernette Mayers have taken dose on 10/3-wife unsure when he takes this Confirmed patient has responsible person to drive home post procedure and observe patient for 24 hours: yes Addl concerns:   Cr 1.42- may have taken Lisi/HCTZ, may have taken Xarelto on 12/17/2016- ask patient upon arrival to short stay

## 2016-12-19 ENCOUNTER — Ambulatory Visit (HOSPITAL_COMMUNITY)
Admission: RE | Admit: 2016-12-19 | Discharge: 2016-12-19 | Disposition: A | Discharge status: Home / Self Care | Payer: Medicare Other | Source: Ambulatory Visit | Attending: Cardiovascular Disease | Admitting: Cardiovascular Disease

## 2016-12-19 ENCOUNTER — Encounter (HOSPITAL_COMMUNITY)
Admission: RE | Disposition: A | Discharge status: Home / Self Care | Payer: Self-pay | Source: Ambulatory Visit | Attending: Cardiovascular Disease

## 2016-12-19 DIAGNOSIS — E669 Obesity, unspecified: Secondary | ICD-10-CM | POA: Insufficient documentation

## 2016-12-19 DIAGNOSIS — Z87891 Personal history of nicotine dependence: Secondary | ICD-10-CM | POA: Insufficient documentation

## 2016-12-19 DIAGNOSIS — E782 Mixed hyperlipidemia: Secondary | ICD-10-CM | POA: Insufficient documentation

## 2016-12-19 DIAGNOSIS — I10 Essential (primary) hypertension: Secondary | ICD-10-CM | POA: Insufficient documentation

## 2016-12-19 DIAGNOSIS — Z79899 Other long term (current) drug therapy: Secondary | ICD-10-CM | POA: Insufficient documentation

## 2016-12-19 DIAGNOSIS — I2510 Atherosclerotic heart disease of native coronary artery without angina pectoris: Secondary | ICD-10-CM | POA: Diagnosis present

## 2016-12-19 DIAGNOSIS — I25119 Atherosclerotic heart disease of native coronary artery with unspecified angina pectoris: Secondary | ICD-10-CM | POA: Diagnosis not present

## 2016-12-19 DIAGNOSIS — I25118 Atherosclerotic heart disease of native coronary artery with other forms of angina pectoris: Secondary | ICD-10-CM

## 2016-12-19 DIAGNOSIS — E785 Hyperlipidemia, unspecified: Secondary | ICD-10-CM | POA: Insufficient documentation

## 2016-12-19 DIAGNOSIS — Z8674 Personal history of sudden cardiac arrest: Secondary | ICD-10-CM | POA: Insufficient documentation

## 2016-12-19 DIAGNOSIS — Z7901 Long term (current) use of anticoagulants: Secondary | ICD-10-CM | POA: Insufficient documentation

## 2016-12-19 HISTORY — PX: LEFT HEART CATH AND CORONARY ANGIOGRAPHY: CATH118249

## 2016-12-19 LAB — BASIC METABOLIC PANEL
ANION GAP: 5 (ref 5–15)
BUN: 22 mg/dL — ABNORMAL HIGH (ref 6–20)
CALCIUM: 8.9 mg/dL (ref 8.9–10.3)
CO2: 24 mmol/L (ref 22–32)
Chloride: 104 mmol/L (ref 101–111)
Creatinine, Ser: 0.95 mg/dL (ref 0.61–1.24)
GFR calc non Af Amer: >60 mL/min (ref >60)
GLUCOSE: 133 mg/dL — AB (ref 65–99)
POTASSIUM: 5.5 mmol/L — AB (ref 3.5–5.1)
SODIUM: 133 mmol/L — AB (ref 135–145)

## 2016-12-19 SURGERY — LEFT HEART CATH AND CORONARY ANGIOGRAPHY
Anesthesia: LOCAL

## 2016-12-19 MED ORDER — MIDAZOLAM HCL 2 MG/2ML IJ SOLN
INTRAMUSCULAR | Status: DC | PRN
  Administered 2016-12-19: 1 mg via INTRAVENOUS

## 2016-12-19 MED ORDER — MIDAZOLAM HCL 2 MG/2ML IJ SOLN
INTRAMUSCULAR | Status: AC
  Filled 2016-12-19: qty 2

## 2016-12-19 MED ORDER — FENTANYL CITRATE (PF) 100 MCG/2ML IJ SOLN
INTRAMUSCULAR | Status: AC
  Filled 2016-12-19: qty 2

## 2016-12-19 MED ORDER — SODIUM CHLORIDE 0.9 % WEIGHT BASED INFUSION
3.0000 mL/kg/h | INTRAVENOUS | Status: AC
  Administered 2016-12-19: 3 mL/kg/h via INTRAVENOUS

## 2016-12-19 MED ORDER — VERAPAMIL HCL 2.5 MG/ML IV SOLN
INTRAVENOUS | Status: DC | PRN
  Administered 2016-12-19: 10 mL via INTRA_ARTERIAL

## 2016-12-19 MED ORDER — SODIUM CHLORIDE 0.9 % WEIGHT BASED INFUSION: 1.0000 mL/kg/h | INTRAVENOUS | Status: DC

## 2016-12-19 MED ORDER — HEPARIN SODIUM (PORCINE) 1000 UNIT/ML IJ SOLN
INTRAMUSCULAR | Status: DC | PRN
  Administered 2016-12-19: 5000 [IU] via INTRAVENOUS

## 2016-12-19 MED ORDER — HEPARIN (PORCINE) IN NACL 2-0.9 UNIT/ML-% IJ SOLN
INTRAMUSCULAR | Status: AC
  Filled 2016-12-19: qty 1000

## 2016-12-19 MED ORDER — SODIUM CHLORIDE 0.9% FLUSH: 3.0000 mL | INTRAVENOUS | Status: DC | PRN

## 2016-12-19 MED ORDER — IOPAMIDOL (ISOVUE-370) INJECTION 76%
INTRAVENOUS | Status: AC
  Filled 2016-12-19: qty 100

## 2016-12-19 MED ORDER — SODIUM CHLORIDE 0.9 % IV SOLN: 250.0000 mL | INTRAVENOUS | Status: DC | PRN

## 2016-12-19 MED ORDER — SODIUM CHLORIDE 0.9% FLUSH: 3.0000 mL | Freq: Two times a day (BID) | INTRAVENOUS | Status: DC

## 2016-12-19 MED ORDER — IOPAMIDOL (ISOVUE-370) INJECTION 76%
INTRAVENOUS | Status: DC | PRN
  Administered 2016-12-19: 90 mL via INTRA_ARTERIAL

## 2016-12-19 MED ORDER — SODIUM CHLORIDE 0.9 % IV SOLN: INTRAVENOUS | Status: DC

## 2016-12-19 MED ORDER — ASPIRIN 81 MG PO CHEW: 81.0000 mg | CHEWABLE_TABLET | ORAL | Status: DC

## 2016-12-19 MED ORDER — FENTANYL CITRATE (PF) 100 MCG/2ML IJ SOLN
INTRAMUSCULAR | Status: DC | PRN
  Administered 2016-12-19: 25 ug via INTRAVENOUS

## 2016-12-19 MED ORDER — VERAPAMIL HCL 2.5 MG/ML IV SOLN
INTRAVENOUS | Status: AC
  Filled 2016-12-19: qty 2

## 2016-12-19 MED ORDER — HEPARIN (PORCINE) IN NACL 2-0.9 UNIT/ML-% IJ SOLN
INTRAMUSCULAR | Status: AC | PRN
  Administered 2016-12-19: 1000 mL via INTRA_ARTERIAL

## 2016-12-19 MED ORDER — HEPARIN SODIUM (PORCINE) 1000 UNIT/ML IJ SOLN
INTRAMUSCULAR | Status: AC
  Filled 2016-12-19: qty 1

## 2016-12-19 MED ORDER — LIDOCAINE HCL 2 % IJ SOLN
INTRAMUSCULAR | Status: DC | PRN
  Administered 2016-12-19: 3 mL via INTRADERMAL

## 2016-12-19 MED ORDER — LIDOCAINE HCL 2 % IJ SOLN
INTRAMUSCULAR | Status: AC
  Filled 2016-12-19: qty 10

## 2016-12-19 SURGICAL SUPPLY — 10 items
CATH IMPULSE 5F ANG/FL3.5 (CATHETERS) ×2 IMPLANT
DEVICE RAD COMP TR BAND LRG (VASCULAR PRODUCTS) ×2 IMPLANT
GLIDESHEATH SLEND SS 6F .021 (SHEATH) ×2 IMPLANT
GUIDEWIRE INQWIRE 1.5J.035X260 (WIRE) ×1 IMPLANT
INQWIRE 1.5J .035X260CM (WIRE) ×2
KIT HEART LEFT (KITS) ×2 IMPLANT
PACK CARDIAC CATHETERIZATION (CUSTOM PROCEDURE TRAY) ×2 IMPLANT
SYR MEDRAD MARK V 150ML (SYRINGE) ×2 IMPLANT
TRANSDUCER W/STOPCOCK (MISCELLANEOUS) ×2 IMPLANT
TUBING CIL FLEX 10 FLL-RA (TUBING) ×2 IMPLANT

## 2016-12-19 NOTE — Discharge Instructions (Signed)
Radial Site Care Refer to this sheet in the next few weeks. These instructions provide you with information about caring for yourself after your procedure. Your health care provider may also give you more specific instructions. Your treatment has been planned according to current medical practices, but problems sometimes occur. Call your health care provider if you have any problems or questions after your procedure. What can I expect after the procedure? After your procedure, it is typical to have the following:  Bruising at the radial site that usually fades within 1-2 weeks.  Blood collecting in the tissue (hematoma) that may be painful to the touch. It should usually decrease in size and tenderness within 1-2 weeks.  Follow these instructions at home:  Take medicines only as directed by your health care provider.  You may shower 24-48 hours after the procedure or as directed by your health care provider. Remove the bandage (dressing) and gently wash the site with plain soap and water. Pat the area dry with a clean towel. Do not rub the site, because this may cause bleeding.  Do not take baths, swim, or use a hot tub until your health care provider approves.  Check your insertion site every day for redness, swelling, or drainage.  Do not apply powder or lotion to the site.  Do not flex or bend the affected arm for 24 hours or as directed by your health care provider.  Do not push or pull heavy objects with the affected arm for 24 hours or as directed by your health care provider.  Do not lift over 10 lb (4.5 kg) for 5 days after your procedure or as directed by your health care provider.  Ask your health care provider when it is okay to: ? Return to work or school. ? Resume usual physical activities or sports. ? Resume sexual activity.  Do not drive home if you are discharged the same day as the procedure. Have someone else drive you.  You may drive 24 hours after the procedure  unless otherwise instructed by your health care provider.  Do not operate machinery or power tools for 24 hours after the procedure.  If your procedure was done as an outpatient procedure, which means that you went home the same day as your procedure, a responsible adult should be with you for the first 24 hours after you arrive home.  Keep all follow-up visits as directed by your health care provider. This is important. Contact a health care provider if:  You have a fever.  You have chills.  You have increased bleeding from the radial site. Hold pressure on the site. CALL 911 Get help right away if:  You have unusual pain at the radial site.  You have redness, warmth, or swelling at the radial site.  You have drainage (other than a small amount of blood on the dressing) from the radial site.  The radial site is bleeding, and the bleeding does not stop after 30 minutes of holding steady pressure on the site.  Your arm or hand becomes pale, cool, tingly, or numb. This information is not intended to replace advice given to you by your health care provider. Make sure you discuss any questions you have with your health care provider. Document Released: 04/05/2010 Document Revised: 08/09/2015 Document Reviewed: 09/19/2013 Elsevier Interactive Patient Education  2018 [employer].   RESUME XARELTO on 12/20/16 if no bleeding from cath site.

## 2016-12-19 NOTE — H&P (View-Only) (Signed)
Cardiology Office Note:    Date:  12/17/2016   ID:  Justin Tanner, DOB 11-16-1960, MRN [id number]  PCP:  Kendra Opitz, NP  Cardiologist:  Garwin Brothers, MD   Referring MD: No ref. provider found    ASSESSMENT:    1. Essential hypertension   2. Sudden cardiac arrest (HCC)   3. Angina pectoris (HCC)   4. Mixed dyslipidemia    PLAN:    In order of problems listed above:  1. I'm concerned about the patient's symptoms. They are suggestive of angina and he has multiple risk factors for coronary artery disease. In view of the patient's symptoms, I discussed with the patient options for evaluation. Invasive and noninvasive options were given to the patient. I discussed stress testing and coronary angiography and left heart catheterization at length. Benefits, pros and cons of each approach were discussed at length. Patient had multiple questions which were answered to the patient's satisfaction. Patient opted for invasive evaluation and we will set up for coronary angiography and left heart catheterization. Further recommendations will be made based on the findings with coronary angiography. In the interim if the patient has any significant symptoms in hospital to the nearest emergency room. 2. His blood pressure stable. 3. I discussed with the patient atrial fibrillation, disease process. Management and therapy including rate and rhythm control, anticoagulation benefits and potential risks were discussed extensively with the patient. Patient had multiple questions which were answered to patient's satisfaction. 4. Patient's chads score is 1 and he will not need long-term anticoagulation. His coronary angiography is scheduled for the afternoon tomorrow which is a finding suggestive asked him to stop his anticoagulation. However after the coronary angiography would like him to be started on 20 mg of xarelto so that when he comes back to see me if he is asymptomatic with his  anticoagulation and would like to consider cardioversion. Eventually I will put him on 325 mg of coated aspirin. 5. Diet was discussed with dyslipidemia and verbalized understanding. He will be seen in follow-up appointment with coronary angiography.patient and his wife had multiple questions which were answered to their satisfaction. Total time for this evaluation was 60 minutes  Medication Adjustments/Labs and Tests Ordered: Current medicines are reviewed at length with the patient today.  Concerns regarding medicines are outlined above.  No orders of the defined types were placed in this encounter.  No orders of the defined types were placed in this encounter.    History of Present Illness:    Justin Tanner is a 56 y.o. male who is being seen today for the evaluation of chest pain suggesting angina at the request of his primary care provider. Patient is a pleasant 56 year old male. He has past medical history of essential hypertension and dyslipidemia. He mentions to me that over the past several weeks when he exerts himself or findings and a stress substernal chest tightness. He went to the neck and to the arm. This of concern to him. No orthopnea or PND. He leads a sedentary lifestyle. He's had a cardiac arrest in the remote past. The time of surgery and anesthesia but he subsequently underwent a complete evaluation for this including coronary angiography and he was told that it was fine. This was many years ago. No details are available at this time.at the time of my evaluation is alert awake oriented and in no distress  Past Medical History:  Diagnosis Date  . Arrhythmia   . HLD (hyperlipidemia)   .  Hypertension   . Obesity     Past Surgical History:  Procedure Laterality Date  . LEFT HEART CATHETERIZATION WITH CORONARY ANGIOGRAM N/A 03/17/2012   Procedure: LEFT HEART CATHETERIZATION WITH CORONARY ANGIOGRAM;  Surgeon: Peter M Swaziland, MD;  Location: [facility] CATH LAB;  Service:  Cardiovascular;  Laterality: N/A;  . LUMBAR LAMINECTOMY/DECOMPRESSION MICRODISCECTOMY  05/14/2012   Procedure: LUMBAR LAMINECTOMY/DECOMPRESSION MICRODISCECTOMY 1 LEVEL;  Surgeon: Reinaldo Meeker, MD;  Location: [facility] NEURO ORS;  Service: Neurosurgery;  Laterality: Right;  Right lumbar four-five microdiskectomy  . NECK SURGERY      Current Medications: Current Meds  Medication Sig  . B Complex Vitamins (VITAMIN B COMPLEX PO) Take 1 tablet by mouth daily with lunch.  . citalopram (CELEXA) 40 MG tablet Take 40 mg by mouth daily.  Marland Kitchen gabapentin (NEURONTIN) 600 MG tablet Take 600 mg by mouth 3 (three) times daily.  Marland Kitchen gemfibrozil (LOPID) 600 MG tablet Take 600 mg by mouth 2 (two) times daily.  Marland Kitchen lisinopril-hydrochlorothiazide (PRINZIDE,ZESTORETIC) 10-12.5 MG per tablet Take 1 tablet by mouth daily.  . metoprolol succinate (TOPROL-XL) 100 MG 24 hr tablet Take 100 mg by mouth daily. Take with or immediately following a meal.  . pramipexole (MIRAPEX) 0.25 MG tablet Take 0.25 mg by mouth daily as needed.  . pravastatin (PRAVACHOL) 40 MG tablet Take 40 mg by mouth at bedtime.  . vitamin C (ASCORBIC ACID) 500 MG tablet Take 500 mg by mouth daily with lunch.   Carlena Hurl 15 MG TABS tablet Take 20 mg by mouth daily. Start this new dose once given the okay after the heart catheterization     Allergies:   Patient has no known allergies.   Social History   Social History  . Marital status: Single    Spouse name: N/A  . Number of children: N/A  . Years of education: N/A   Occupational History  . Ironworker - heavy work    Social History Main Topics  . Smoking status: Former Games developer  . Smokeless tobacco: Former Neurosurgeon  . Alcohol use No     Comment: Former ETOH use - up to 1 6pk per day.    . Drug use: No  . Sexual activity: Not Asked   Other Topics Concern  . None   Social History Narrative   Lives with wife, no tobacco, borderline DM     Family History: The patient's family history includes  Heart disease in his father and mother; Hypertension in his father.  ROS:   Please see the history of present illness.    All other systems reviewed and are negative.  EKGs/Labs/Other Studies Reviewed:    The following studies were reviewed today: I reviewed her primary care office notes, lab work and EKGs extensively and discussed the findings with the patient.   Recent Labs: No results found for requested labs within last 8760 hours.  Recent Lipid Panel No results found for: CHOL, TRIG, HDL, CHOLHDL, VLDL, LDLCALC, LDLDIRECT  Physical Exam:    VS:  BP 102/64   Pulse 94   Ht  (1.753 m)   Wt 222 lb 0.6 oz (100.7 kg)   SpO2 98%   BMI 32.79 kg/m     Wt Readings from Last 3 Encounters:  12/17/16 222 lb 0.6 oz (100.7 kg)  04/27/2012 210 lb 5.1 oz (95.4 kg)     GEN: Patient is in no acute distress HEENT: Normal NECK: No JVD; No carotid bruits LYMPHATICS: No lymphadenopathy CARDIAC: S1 S2 regular,  2/6 systolic murmur at the apex. RESPIRATORY:  Clear to auscultation without rales, wheezing or rhonchi  ABDOMEN: Soft, non-tender, non-distended MUSCULOSKELETAL:  No edema; No deformity  SKIN: Warm and dry NEUROLOGIC:  Alert and oriented x 3 PSYCHIATRIC:  Normal affect    Signed, Garwin Brothers, MD  12/17/2016 9:49 AM    [facility]

## 2016-12-19 NOTE — Interval H&P Note (Signed)
History and Physical Interval Note:  12/19/2016 11:41 AM  Justin Tanner  has presented today for cardiac cath  with the diagnosis of CAD, unstable angina. The various methods of treatment have been discussed with the patient and family. After consideration of risks, benefits and other options for treatment, the patient has consented to  Procedure(s): LEFT HEART CATH AND CORONARY ANGIOGRAPHY (N/A) as a surgical intervention .  The patient's history has been reviewed, patient examined, no change in status, stable for surgery.  I have reviewed the patient's chart and labs.  Questions were answered to the patient's satisfaction.    Cath Lab Visit (complete for each Cath Lab visit)  Clinical Evaluation Leading to the Procedure:   ACS: No.  Non-ACS:    Anginal Classification: CCS III  Anti-ischemic medical therapy: Minimal Therapy (1 class of medications)  Non-Invasive Test Results: No non-invasive testing performed  Prior CABG: No previous CABG         Verne Carrow

## 2016-12-22 ENCOUNTER — Encounter (HOSPITAL_COMMUNITY): Payer: Self-pay | Admitting: Cardiovascular Disease

## 2017-01-02 ENCOUNTER — Ambulatory Visit (HOSPITAL_BASED_OUTPATIENT_CLINIC_OR_DEPARTMENT_OTHER): Payer: Medicare Other

## 2017-01-13 ENCOUNTER — Ambulatory Visit: Payer: Medicare Other | Admitting: Cardiology

## 2017-01-20 ENCOUNTER — Ambulatory Visit (HOSPITAL_BASED_OUTPATIENT_CLINIC_OR_DEPARTMENT_OTHER): Payer: Medicare Other

## 2017-01-23 ENCOUNTER — Ambulatory Visit: Payer: Medicare Other | Admitting: Cardiology

## 2017-02-19 ENCOUNTER — Ambulatory Visit (HOSPITAL_BASED_OUTPATIENT_CLINIC_OR_DEPARTMENT_OTHER): Payer: Medicare Other

## 2017-02-23 ENCOUNTER — Ambulatory Visit: Payer: Medicare Other | Admitting: Cardiology

## 2017-02-24 ENCOUNTER — Ambulatory Visit: Payer: Medicare Other | Admitting: Cardiology

## 2017-03-03 ENCOUNTER — Ambulatory Visit: Payer: Medicare Other | Admitting: Cardiology

## 2017-03-04 ENCOUNTER — Encounter: Payer: Self-pay | Admitting: Cardiology

## 2017-03-12 ENCOUNTER — Other Ambulatory Visit: Payer: Self-pay

## 2017-03-12 MED ORDER — XARELTO 15 MG PO TABS
15.0000 mg | ORAL_TABLET | Freq: Every day | ORAL | 1 refills | Status: DC
Start: 2017-03-12 — End: 2022-02-12

## 2017-03-12 NOTE — Telephone Encounter (Signed)
Follow up phone call to schedule appointment with Dr. Tomie Chinaevankar. Patient understands the necessity of appointment post heart cath. Patient shared concern regarding his xarelto; states that he has not taken it since his heart cath as he was unsure if he was supposed to be taking this. Per Dr. Dulce SellarMunley the patient is okay to take this; refill sent.

## 2017-03-20 ENCOUNTER — Other Ambulatory Visit: Payer: Self-pay

## 2017-03-20 ENCOUNTER — Encounter: Payer: Self-pay | Admitting: Cardiology

## 2017-03-20 ENCOUNTER — Ambulatory Visit (INDEPENDENT_AMBULATORY_CARE_PROVIDER_SITE_OTHER): Payer: Medicare Other | Admitting: Cardiology

## 2017-03-20 VITALS — BP 110/76 | Ht 69.0 in | Wt 230.8 lb

## 2017-03-20 DIAGNOSIS — I10 Essential (primary) hypertension: Secondary | ICD-10-CM

## 2017-03-20 DIAGNOSIS — I480 Paroxysmal atrial fibrillation: Secondary | ICD-10-CM

## 2017-03-20 DIAGNOSIS — I2510 Atherosclerotic heart disease of native coronary artery without angina pectoris: Secondary | ICD-10-CM

## 2017-03-20 DIAGNOSIS — E782 Mixed hyperlipidemia: Secondary | ICD-10-CM

## 2017-03-20 NOTE — Patient Instructions (Signed)
Medication Instructions:  Your physician recommends that you continue on your current medications as directed. Please refer to the Current Medication list given to you today.  Labwork: None  Testing/Procedures: None  Follow-Up: Your physician recommends that you schedule a follow-up appointment in: 6 months  Any Other Special Instructions Will Be Listed Below (If Applicable).     If you need a refill on your cardiac medications before your next appointment, please call your pharmacy.   [facility]  Marlaya Turck A, RN, BSN  

## 2017-03-20 NOTE — Progress Notes (Signed)
Cardiology Office Note:    Date:  03/20/2017   ID:  Justin Tanner, DOB 12/21/1960, MRN [id number]  PCP:  Patient, No Pcp Per  Cardiologist:  Garwin Brothersajan R Nirvan Laban, MD   Referring MD: No ref. provider found    ASSESSMENT:    1. Coronary artery disease involving native coronary artery of native heart without angina pectoris   2. Essential hypertension   3. Paroxysmal atrial fibrillation (HCC)   4. Mixed dyslipidemia    PLAN:    In order of problems listed above:  1. In the prevention stressed with the patient.  Importance of compliance with diet and medications stressed and the patient vocalized understanding. 2. Lipids are followed by primary care physician.  Diet was discussed. 3. EKG done today reveals sinus rhythm and nonspecific ST-T changes. 4. His chads score is 1 he will take and continue taking full-strength aspirin. 5. Patient will be seen in follow-up appointment in 6 months or earlier if the patient has any concerns    Medication Adjustments/Labs and Tests Ordered: Current medicines are reviewed at length with the patient today.  Concerns regarding medicines are outlined above.  Orders Placed This Encounter  Procedures  . EKG 12-Lead   No orders of the defined types were placed in this encounter.    Chief Complaint  Patient presents with  . Heart Cath Follow Up    x7 days, R arm pain radiating to hand, intermittent, 9 on pain scale,      History of Present Illness:    Justin Tanner is a 57 y.o. male.  The patient has known coronary artery disease.  He denies any problems from a cardiovascular standpoint.  He takes care of activities of daily living.  He denies any chest pain orthopnea or PND.  He mentions to me that he had a fall and has pain on the right side since the fall.  He has not seen any doctor for this.  He ambulated well, in and out of her office today.  Past Medical History:  Diagnosis Date  . Arrhythmia   . HLD (hyperlipidemia)   .  Hypertension   . Obesity     Past Surgical History:  Procedure Laterality Date  . LEFT HEART CATH AND CORONARY ANGIOGRAPHY N/A 12/19/2016   Procedure: LEFT HEART CATH AND CORONARY ANGIOGRAPHY;  Surgeon: Kathleene HazelMcAlhany, Christopher D, MD;  Location: [facility] INVASIVE CV LAB;  Service: Cardiovascular;  Laterality: N/A;  . LEFT HEART CATHETERIZATION WITH CORONARY ANGIOGRAM N/A 02/11/2013   Procedure: LEFT HEART CATHETERIZATION WITH CORONARY ANGIOGRAM;  Surgeon: Peter M SwazilandJordan, MD;  [facility];  Service: Cardiovascular;  Laterality: N/A;  . LUMBAR LAMINECTOMY/DECOMPRESSION MICRODISCECTOMY  05/08/2012   Procedure: LUMBAR LAMINECTOMY/DECOMPRESSION MICRODISCECTOMY 1 LEVEL;  Surgeon: Reinaldo Meekerandy O Kritzer, MD;  Location: [facility] NEURO ORS;  Service: Neurosurgery;  Laterality: Right;  Right lumbar four-five microdiskectomy  . NECK SURGERY      Current Medications: Current Meds  Medication Sig  . aspirin EC 81 MG tablet Take 81 mg by mouth daily.  . citalopram (CELEXA) 40 MG tablet Take 40 mg by mouth daily.  Marland Kitchen. gabapentin (NEURONTIN) 600 MG tablet Take 600 mg by mouth 3 (three) times daily.  Marland Kitchen. gemfibrozil (LOPID) 600 MG tablet Take 600 mg by mouth 2 (two) times daily.  Marland Kitchen. lisinopril-hydrochlorothiazide (PRINZIDE,ZESTORETIC) 10-12.5 MG per tablet Take 1 tablet by mouth daily.  . metoprolol succinate (TOPROL-XL) 100 MG 24 hr tablet Take 100 mg by mouth daily. Take with or immediately  following a meal.  . pravastatin (PRAVACHOL) 40 MG tablet Take 40 mg by mouth at bedtime.  Carlena Hurl 15 MG TABS tablet Take 1 tablet (15 mg total) by mouth daily with supper. Start this new dose once given the okay after the heart catheterization     Allergies:   Patient has no known allergies.   Social History   Socioeconomic History  . Marital status: Married    Spouse name: None  . Number of children: None  . Years of education: None  . Highest education level: None  Social Needs  . Financial resource strain: None  . Food  insecurity - worry: None  . Food insecurity - inability: None  . Transportation needs - medical: None  . Transportation needs - non-medical: None  Occupational History  . 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 Smoking status: Former Games developer  . Smokeless tobacco: Former Engineer, water and Sexual Activity  . Alcohol use: No    Comment: Former ETOH use - up to 1 6pk per day.    . Drug use: No  . Sexual activity: None  Other Topics Concern  . None  Social History Narrative   Lives with wife, no tobacco, borderline DM     Family History: The patient's family history includes Heart disease in his father and mother; Hypertension in his father.  ROS:   Please see the history of present illness.    All other systems reviewed and are negative.  EKGs/Labs/Other Studies Reviewed:    The following studies were reviewed today: I discussed the findings of recent visits and coronary angiography again with the patient.   Recent Labs: 12/17/2016: Hemoglobin 14.0 12/19/2016: BUN 22; Creatinine, Ser 0.95; Potassium 5.5; Sodium 133  Recent Lipid Panel No results found for: CHOL, TRIG, HDL, CHOLHDL, VLDL, LDLCALC, LDLDIRECT  Physical Exam:    VS:  BP 110/76 (BP Location: Left Arm, Patient Position: Sitting, Cuff Size: Large)   Ht 5\' 9"  (1.753 m)   Wt 230 lb 12.8 oz (104.7 kg)   BMI 34.08 kg/m     Wt Readings from Last 3 Encounters:  03/20/17 230 lb 12.8 oz (104.7 kg)  12/19/16 222 lb (100.7 kg)  12/17/16 222 lb 0.6 oz (100.7 kg)     GEN: Patient is in no acute distress HEENT: Normal NECK: No JVD; No carotid bruits LYMPHATICS: No lymphadenopathy CARDIAC: Hear sounds regular, 2/6 systolic murmur at the apex. RESPIRATORY:  Clear to auscultation without rales, wheezing or rhonchi  ABDOMEN: Soft, non-tender, non-distended MUSCULOSKELETAL:  No edema; No deformity  SKIN: Warm and dry NEUROLOGIC:  Alert and oriented x 3 PSYCHIATRIC:  Normal affect   Signed, Garwin Brothers, MD  03/20/2017 3:37 PM    [facility]

## 2017-10-10 IMAGING — DX DG CHEST 2V
2 series · 2 of 2 positions shown · non-contrast
Comparison: Chest x-ray of September 14, 2015

CLINICAL DATA: Chest pain and shortness of breath. History of
coronary artery disease, cardiac dysrhythmia, hyperlipidemia,
nonsmoker.

EXAM:
CHEST  2 VIEW

[chest pa]
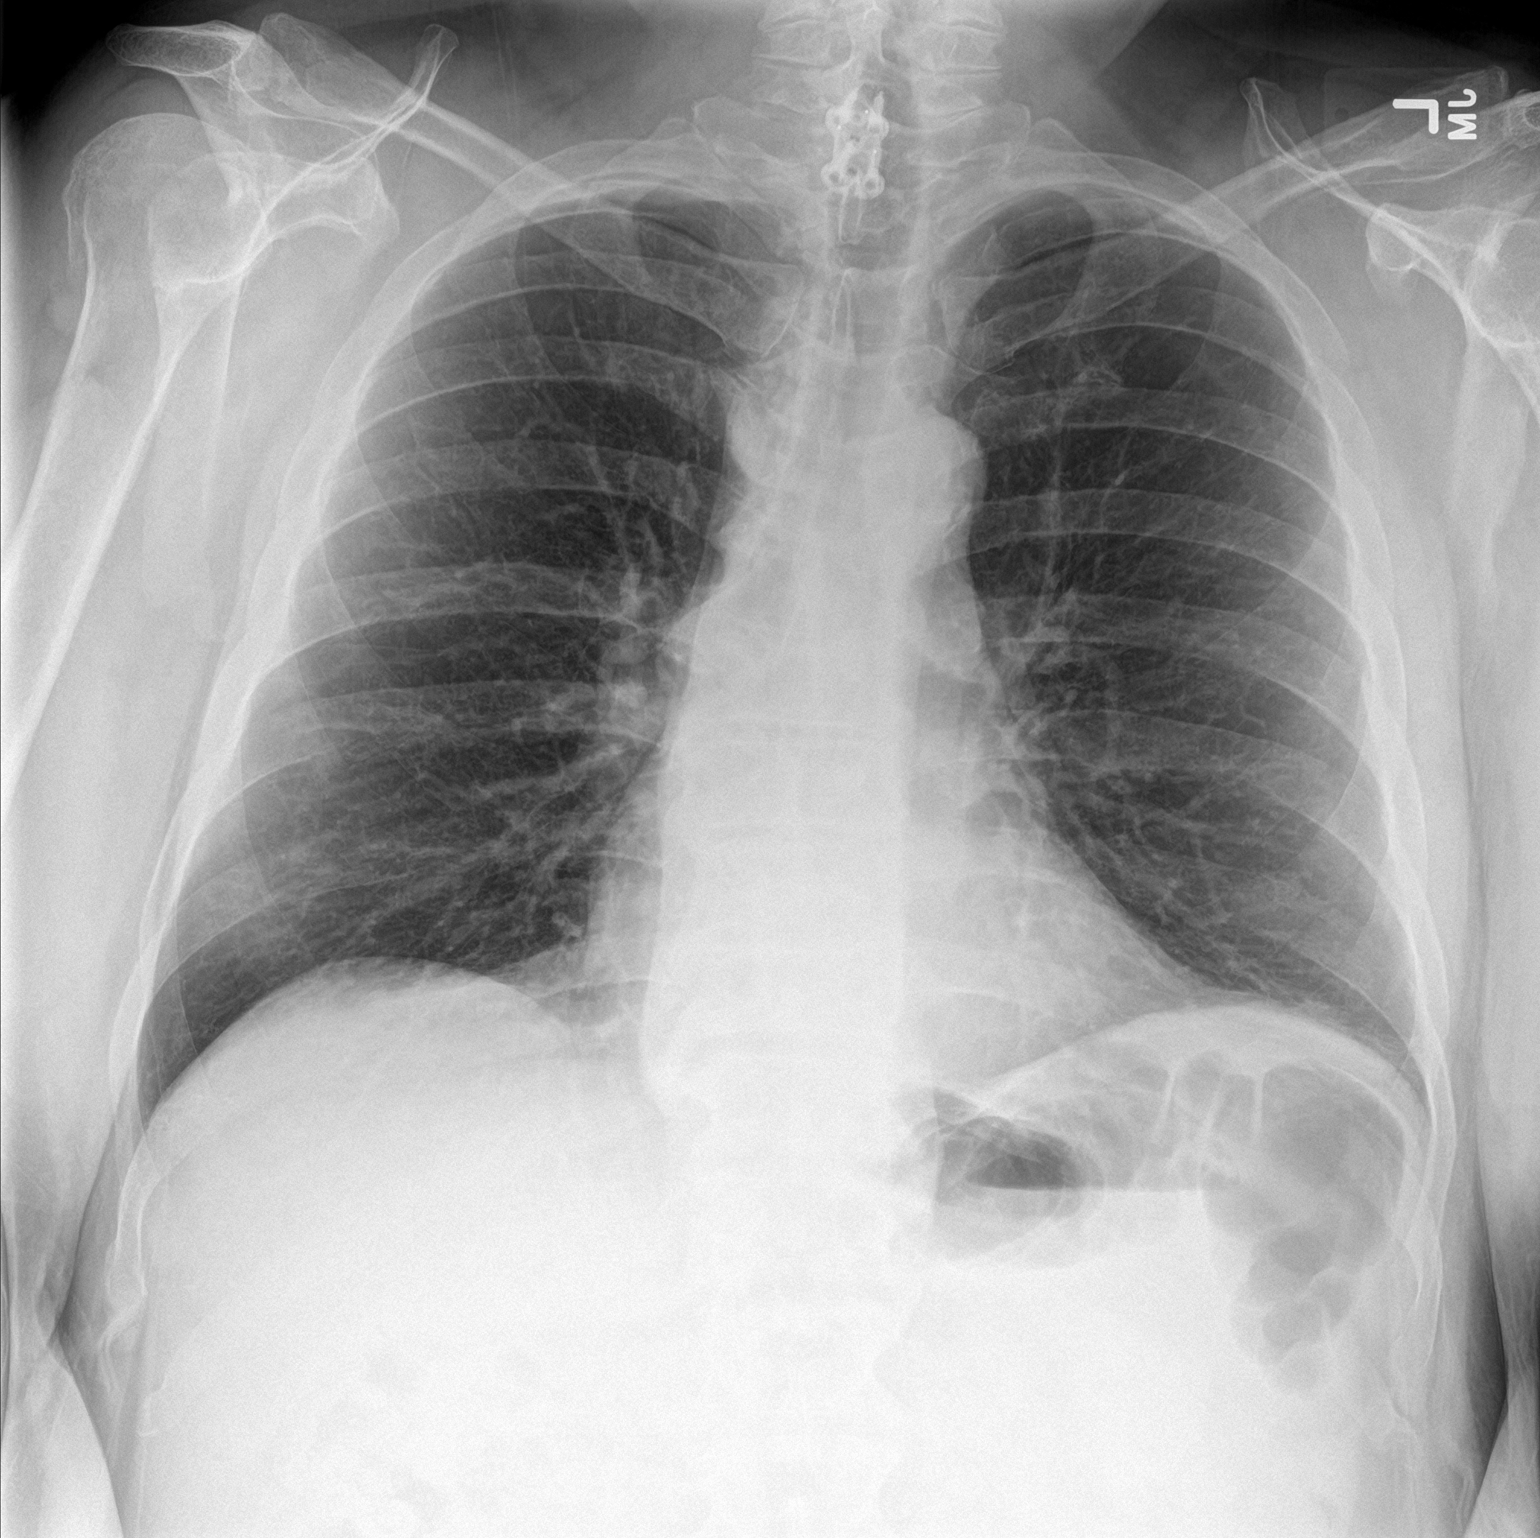

[chest lat]
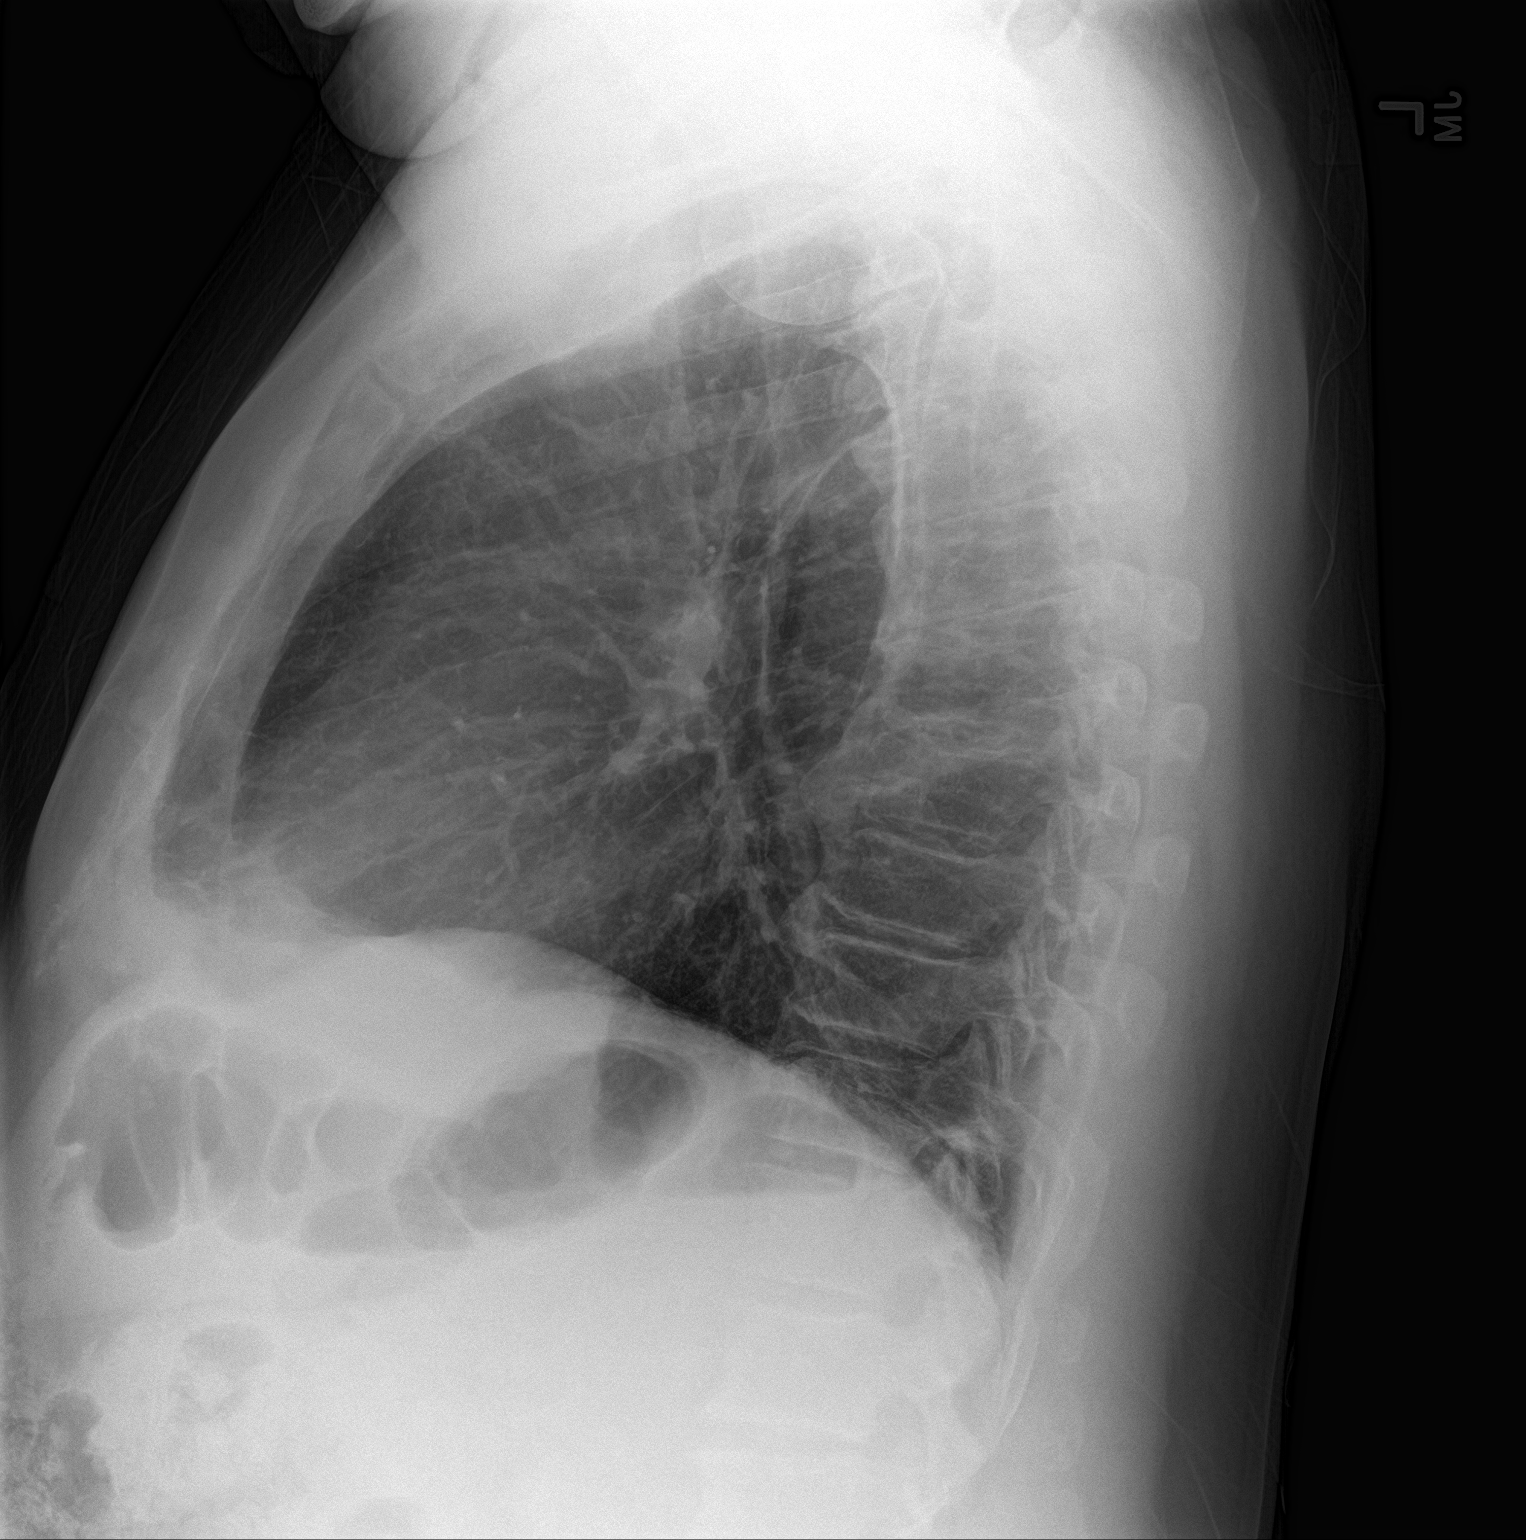

[2 of 2 positions shown; findings below may reference images not displayed]

FINDINGS: The lungs are well-expanded and clear. Previously demonstrated
infiltrate in the superior segment of the right lower lobe has
cleared. The heart and pulmonary vascularity are normal. The
mediastinum is normal in width. There is calcification in the wall
of the aortic arch. The bony thorax exhibits no acute abnormality.
IMPRESSION: There is no acute cardiopulmonary abnormality.

Thoracic aortic atherosclerosis.

## 2019-05-20 DIAGNOSIS — R519 Headache, unspecified: Secondary | ICD-10-CM | POA: Diagnosis not present

## 2019-05-20 DIAGNOSIS — Z79899 Other long term (current) drug therapy: Secondary | ICD-10-CM | POA: Diagnosis not present

## 2019-05-25 DIAGNOSIS — Z7901 Long term (current) use of anticoagulants: Secondary | ICD-10-CM | POA: Diagnosis not present

## 2019-06-24 DIAGNOSIS — R519 Headache, unspecified: Secondary | ICD-10-CM | POA: Diagnosis not present

## 2019-07-25 DIAGNOSIS — G8929 Other chronic pain: Secondary | ICD-10-CM | POA: Diagnosis not present

## 2019-07-25 DIAGNOSIS — Z79899 Other long term (current) drug therapy: Secondary | ICD-10-CM | POA: Diagnosis not present

## 2019-07-25 DIAGNOSIS — E118 Type 2 diabetes mellitus with unspecified complications: Secondary | ICD-10-CM | POA: Diagnosis not present

## 2019-07-25 DIAGNOSIS — R519 Headache, unspecified: Secondary | ICD-10-CM | POA: Diagnosis not present

## 2019-07-25 DIAGNOSIS — I10 Essential (primary) hypertension: Secondary | ICD-10-CM | POA: Diagnosis not present

## 2019-07-25 DIAGNOSIS — E785 Hyperlipidemia, unspecified: Secondary | ICD-10-CM | POA: Diagnosis not present

## 2019-07-25 DIAGNOSIS — M542 Cervicalgia: Secondary | ICD-10-CM | POA: Diagnosis not present

## 2019-08-26 DIAGNOSIS — Z79899 Other long term (current) drug therapy: Secondary | ICD-10-CM | POA: Diagnosis not present

## 2019-08-26 DIAGNOSIS — M549 Dorsalgia, unspecified: Secondary | ICD-10-CM | POA: Diagnosis not present

## 2019-08-26 DIAGNOSIS — G8929 Other chronic pain: Secondary | ICD-10-CM | POA: Diagnosis not present

## 2019-08-26 DIAGNOSIS — M542 Cervicalgia: Secondary | ICD-10-CM | POA: Diagnosis not present

## 2019-10-28 DIAGNOSIS — M542 Cervicalgia: Secondary | ICD-10-CM | POA: Diagnosis not present

## 2019-10-28 DIAGNOSIS — E118 Type 2 diabetes mellitus with unspecified complications: Secondary | ICD-10-CM | POA: Diagnosis not present

## 2019-10-28 DIAGNOSIS — G8929 Other chronic pain: Secondary | ICD-10-CM | POA: Diagnosis not present

## 2019-10-28 DIAGNOSIS — I10 Essential (primary) hypertension: Secondary | ICD-10-CM | POA: Diagnosis not present

## 2019-10-28 DIAGNOSIS — E785 Hyperlipidemia, unspecified: Secondary | ICD-10-CM | POA: Diagnosis not present

## 2019-12-08 DIAGNOSIS — M549 Dorsalgia, unspecified: Secondary | ICD-10-CM | POA: Diagnosis not present

## 2019-12-08 DIAGNOSIS — M542 Cervicalgia: Secondary | ICD-10-CM | POA: Diagnosis not present

## 2019-12-08 DIAGNOSIS — G8929 Other chronic pain: Secondary | ICD-10-CM | POA: Diagnosis not present

## 2019-12-14 DIAGNOSIS — Z9181 History of falling: Secondary | ICD-10-CM | POA: Diagnosis not present

## 2019-12-14 DIAGNOSIS — E785 Hyperlipidemia, unspecified: Secondary | ICD-10-CM | POA: Diagnosis not present

## 2019-12-14 DIAGNOSIS — Z0000 Encounter for general adult medical examination without abnormal findings: Secondary | ICD-10-CM | POA: Diagnosis not present

## 2021-02-19 DIAGNOSIS — Z79899 Other long term (current) drug therapy: Secondary | ICD-10-CM | POA: Diagnosis not present

## 2021-02-19 DIAGNOSIS — M542 Cervicalgia: Secondary | ICD-10-CM | POA: Diagnosis not present

## 2021-02-19 DIAGNOSIS — M109 Gout, unspecified: Secondary | ICD-10-CM | POA: Diagnosis not present

## 2021-02-19 DIAGNOSIS — G8929 Other chronic pain: Secondary | ICD-10-CM | POA: Diagnosis not present

## 2021-02-19 DIAGNOSIS — M549 Dorsalgia, unspecified: Secondary | ICD-10-CM | POA: Diagnosis not present

## 2021-04-02 DIAGNOSIS — Z79899 Other long term (current) drug therapy: Secondary | ICD-10-CM | POA: Diagnosis not present

## 2021-04-02 DIAGNOSIS — M109 Gout, unspecified: Secondary | ICD-10-CM | POA: Diagnosis not present

## 2021-04-02 DIAGNOSIS — M549 Dorsalgia, unspecified: Secondary | ICD-10-CM | POA: Diagnosis not present

## 2021-04-02 DIAGNOSIS — G8929 Other chronic pain: Secondary | ICD-10-CM | POA: Diagnosis not present

## 2021-04-02 DIAGNOSIS — M542 Cervicalgia: Secondary | ICD-10-CM | POA: Diagnosis not present

## 2021-05-14 DIAGNOSIS — M109 Gout, unspecified: Secondary | ICD-10-CM | POA: Diagnosis not present

## 2021-05-14 DIAGNOSIS — M542 Cervicalgia: Secondary | ICD-10-CM | POA: Diagnosis not present

## 2021-05-14 DIAGNOSIS — Z1211 Encounter for screening for malignant neoplasm of colon: Secondary | ICD-10-CM | POA: Diagnosis not present

## 2021-05-14 DIAGNOSIS — E785 Hyperlipidemia, unspecified: Secondary | ICD-10-CM | POA: Diagnosis not present

## 2021-05-14 DIAGNOSIS — E118 Type 2 diabetes mellitus with unspecified complications: Secondary | ICD-10-CM | POA: Diagnosis not present

## 2021-05-14 DIAGNOSIS — Z79899 Other long term (current) drug therapy: Secondary | ICD-10-CM | POA: Diagnosis not present

## 2021-05-14 DIAGNOSIS — I10 Essential (primary) hypertension: Secondary | ICD-10-CM | POA: Diagnosis not present

## 2021-05-14 DIAGNOSIS — G8929 Other chronic pain: Secondary | ICD-10-CM | POA: Diagnosis not present

## 2021-07-19 DIAGNOSIS — G8929 Other chronic pain: Secondary | ICD-10-CM | POA: Diagnosis not present

## 2021-07-19 DIAGNOSIS — E1169 Type 2 diabetes mellitus with other specified complication: Secondary | ICD-10-CM | POA: Diagnosis not present

## 2021-07-19 DIAGNOSIS — M542 Cervicalgia: Secondary | ICD-10-CM | POA: Diagnosis not present

## 2021-08-26 DIAGNOSIS — Z79899 Other long term (current) drug therapy: Secondary | ICD-10-CM | POA: Diagnosis not present

## 2021-08-26 DIAGNOSIS — M109 Gout, unspecified: Secondary | ICD-10-CM | POA: Diagnosis not present

## 2021-08-26 DIAGNOSIS — M542 Cervicalgia: Secondary | ICD-10-CM | POA: Diagnosis not present

## 2021-08-26 DIAGNOSIS — I10 Essential (primary) hypertension: Secondary | ICD-10-CM | POA: Diagnosis not present

## 2021-08-26 DIAGNOSIS — E1169 Type 2 diabetes mellitus with other specified complication: Secondary | ICD-10-CM | POA: Diagnosis not present

## 2021-08-26 DIAGNOSIS — G8929 Other chronic pain: Secondary | ICD-10-CM | POA: Diagnosis not present

## 2021-08-26 DIAGNOSIS — G2581 Restless legs syndrome: Secondary | ICD-10-CM | POA: Diagnosis not present

## 2021-08-26 DIAGNOSIS — E785 Hyperlipidemia, unspecified: Secondary | ICD-10-CM | POA: Diagnosis not present

## 2021-09-10 DIAGNOSIS — N182 Chronic kidney disease, stage 2 (mild): Secondary | ICD-10-CM | POA: Diagnosis not present

## 2021-09-10 DIAGNOSIS — G8929 Other chronic pain: Secondary | ICD-10-CM | POA: Diagnosis not present

## 2021-09-10 DIAGNOSIS — M542 Cervicalgia: Secondary | ICD-10-CM | POA: Diagnosis not present

## 2021-09-10 DIAGNOSIS — G2581 Restless legs syndrome: Secondary | ICD-10-CM | POA: Diagnosis not present

## 2021-10-24 DIAGNOSIS — M542 Cervicalgia: Secondary | ICD-10-CM | POA: Diagnosis not present

## 2021-10-24 DIAGNOSIS — N1831 Chronic kidney disease, stage 3a: Secondary | ICD-10-CM | POA: Diagnosis not present

## 2021-10-24 DIAGNOSIS — N182 Chronic kidney disease, stage 2 (mild): Secondary | ICD-10-CM | POA: Diagnosis not present

## 2021-11-28 DIAGNOSIS — E1169 Type 2 diabetes mellitus with other specified complication: Secondary | ICD-10-CM | POA: Diagnosis not present

## 2021-11-28 DIAGNOSIS — G8929 Other chronic pain: Secondary | ICD-10-CM | POA: Diagnosis not present

## 2021-11-28 DIAGNOSIS — E785 Hyperlipidemia, unspecified: Secondary | ICD-10-CM | POA: Diagnosis not present

## 2021-11-28 DIAGNOSIS — N1831 Chronic kidney disease, stage 3a: Secondary | ICD-10-CM | POA: Diagnosis not present

## 2021-11-28 DIAGNOSIS — R21 Rash and other nonspecific skin eruption: Secondary | ICD-10-CM | POA: Diagnosis not present

## 2021-11-28 DIAGNOSIS — I10 Essential (primary) hypertension: Secondary | ICD-10-CM | POA: Diagnosis not present

## 2021-11-28 DIAGNOSIS — M542 Cervicalgia: Secondary | ICD-10-CM | POA: Diagnosis not present

## 2021-11-28 DIAGNOSIS — I4891 Unspecified atrial fibrillation: Secondary | ICD-10-CM | POA: Diagnosis not present

## 2021-11-28 DIAGNOSIS — M109 Gout, unspecified: Secondary | ICD-10-CM | POA: Diagnosis not present

## 2021-12-27 DIAGNOSIS — G8929 Other chronic pain: Secondary | ICD-10-CM | POA: Diagnosis not present

## 2021-12-27 DIAGNOSIS — M542 Cervicalgia: Secondary | ICD-10-CM | POA: Diagnosis not present

## 2021-12-27 DIAGNOSIS — M549 Dorsalgia, unspecified: Secondary | ICD-10-CM | POA: Diagnosis not present

## 2021-12-27 DIAGNOSIS — Z72820 Sleep deprivation: Secondary | ICD-10-CM | POA: Diagnosis not present

## 2022-01-27 DIAGNOSIS — Z23 Encounter for immunization: Secondary | ICD-10-CM | POA: Diagnosis not present

## 2022-01-27 DIAGNOSIS — G8929 Other chronic pain: Secondary | ICD-10-CM | POA: Diagnosis not present

## 2022-01-27 DIAGNOSIS — Z79899 Other long term (current) drug therapy: Secondary | ICD-10-CM | POA: Diagnosis not present

## 2022-01-27 DIAGNOSIS — M109 Gout, unspecified: Secondary | ICD-10-CM | POA: Diagnosis not present

## 2022-01-27 DIAGNOSIS — M542 Cervicalgia: Secondary | ICD-10-CM | POA: Diagnosis not present

## 2022-02-07 ENCOUNTER — Other Ambulatory Visit: Payer: Self-pay

## 2022-02-07 ENCOUNTER — Inpatient Hospital Stay (HOSPITAL_COMMUNITY)
Admission: EM | Admit: 2022-02-07 | Discharge: 2022-02-12 | DRG: 871 | Disposition: A | Discharge status: Home Health | Payer: Medicare Other | Attending: Family Medicine | Admitting: Family Medicine

## 2022-02-07 ENCOUNTER — Emergency Department (HOSPITAL_COMMUNITY): Payer: Medicare Other

## 2022-02-07 DIAGNOSIS — E876 Hypokalemia: Secondary | ICD-10-CM | POA: Diagnosis not present

## 2022-02-07 DIAGNOSIS — I480 Paroxysmal atrial fibrillation: Secondary | ICD-10-CM | POA: Diagnosis not present

## 2022-02-07 DIAGNOSIS — E440 Moderate protein-calorie malnutrition: Secondary | ICD-10-CM | POA: Diagnosis not present

## 2022-02-07 DIAGNOSIS — J9600 Acute respiratory failure, unspecified whether with hypoxia or hypercapnia: Secondary | ICD-10-CM

## 2022-02-07 DIAGNOSIS — R4701 Aphasia: Secondary | ICD-10-CM | POA: Diagnosis present

## 2022-02-07 DIAGNOSIS — Z79899 Other long term (current) drug therapy: Secondary | ICD-10-CM

## 2022-02-07 DIAGNOSIS — Z781 Physical restraint status: Secondary | ICD-10-CM

## 2022-02-07 DIAGNOSIS — F32A Depression, unspecified: Secondary | ICD-10-CM | POA: Diagnosis present

## 2022-02-07 DIAGNOSIS — R4781 Slurred speech: Secondary | ICD-10-CM | POA: Diagnosis not present

## 2022-02-07 DIAGNOSIS — N179 Acute kidney failure, unspecified: Secondary | ICD-10-CM | POA: Diagnosis not present

## 2022-02-07 DIAGNOSIS — R569 Unspecified convulsions: Secondary | ICD-10-CM | POA: Diagnosis not present

## 2022-02-07 DIAGNOSIS — F1910 Other psychoactive substance abuse, uncomplicated: Secondary | ICD-10-CM

## 2022-02-07 DIAGNOSIS — L89322 Pressure ulcer of left buttock, stage 2: Secondary | ICD-10-CM | POA: Diagnosis present

## 2022-02-07 DIAGNOSIS — R4182 Altered mental status, unspecified: Secondary | ICD-10-CM | POA: Diagnosis not present

## 2022-02-07 DIAGNOSIS — R29818 Other symptoms and signs involving the nervous system: Secondary | ICD-10-CM | POA: Diagnosis not present

## 2022-02-07 DIAGNOSIS — E785 Hyperlipidemia, unspecified: Secondary | ICD-10-CM | POA: Diagnosis present

## 2022-02-07 DIAGNOSIS — G928 Other toxic encephalopathy: Secondary | ICD-10-CM | POA: Diagnosis not present

## 2022-02-07 DIAGNOSIS — Z634 Disappearance and death of family member: Secondary | ICD-10-CM

## 2022-02-07 DIAGNOSIS — J9601 Acute respiratory failure with hypoxia: Secondary | ICD-10-CM | POA: Diagnosis not present

## 2022-02-07 DIAGNOSIS — I5042 Chronic combined systolic (congestive) and diastolic (congestive) heart failure: Secondary | ICD-10-CM | POA: Diagnosis present

## 2022-02-07 DIAGNOSIS — J189 Pneumonia, unspecified organism: Secondary | ICD-10-CM | POA: Diagnosis present

## 2022-02-07 DIAGNOSIS — E162 Hypoglycemia, unspecified: Secondary | ICD-10-CM | POA: Diagnosis not present

## 2022-02-07 DIAGNOSIS — R55 Syncope and collapse: Secondary | ICD-10-CM | POA: Diagnosis not present

## 2022-02-07 DIAGNOSIS — I499 Cardiac arrhythmia, unspecified: Secondary | ICD-10-CM | POA: Diagnosis not present

## 2022-02-07 DIAGNOSIS — Z8674 Personal history of sudden cardiac arrest: Secondary | ICD-10-CM

## 2022-02-07 DIAGNOSIS — Z8249 Family history of ischemic heart disease and other diseases of the circulatory system: Secondary | ICD-10-CM

## 2022-02-07 DIAGNOSIS — T43651A Poisoning by methamphetamines accidental (unintentional), initial encounter: Secondary | ICD-10-CM | POA: Diagnosis present

## 2022-02-07 DIAGNOSIS — R600 Localized edema: Secondary | ICD-10-CM | POA: Diagnosis not present

## 2022-02-07 DIAGNOSIS — R739 Hyperglycemia, unspecified: Secondary | ICD-10-CM | POA: Diagnosis not present

## 2022-02-07 DIAGNOSIS — D649 Anemia, unspecified: Secondary | ICD-10-CM | POA: Diagnosis present

## 2022-02-07 DIAGNOSIS — E872 Acidosis, unspecified: Secondary | ICD-10-CM | POA: Diagnosis not present

## 2022-02-07 DIAGNOSIS — I110 Hypertensive heart disease with heart failure: Secondary | ICD-10-CM | POA: Diagnosis not present

## 2022-02-07 DIAGNOSIS — A419 Sepsis, unspecified organism: Principal | ICD-10-CM | POA: Diagnosis present

## 2022-02-07 DIAGNOSIS — Z7982 Long term (current) use of aspirin: Secondary | ICD-10-CM

## 2022-02-07 DIAGNOSIS — I4891 Unspecified atrial fibrillation: Secondary | ICD-10-CM

## 2022-02-07 DIAGNOSIS — Z4682 Encounter for fitting and adjustment of non-vascular catheter: Secondary | ICD-10-CM | POA: Diagnosis not present

## 2022-02-07 DIAGNOSIS — Z87891 Personal history of nicotine dependence: Secondary | ICD-10-CM | POA: Diagnosis not present

## 2022-02-07 DIAGNOSIS — I2510 Atherosclerotic heart disease of native coronary artery without angina pectoris: Secondary | ICD-10-CM | POA: Diagnosis present

## 2022-02-07 DIAGNOSIS — I959 Hypotension, unspecified: Secondary | ICD-10-CM | POA: Diagnosis not present

## 2022-02-07 DIAGNOSIS — R7401 Elevation of levels of liver transaminase levels: Secondary | ICD-10-CM | POA: Diagnosis not present

## 2022-02-07 DIAGNOSIS — Z7901 Long term (current) use of anticoagulants: Secondary | ICD-10-CM

## 2022-02-07 DIAGNOSIS — Z6825 Body mass index (BMI) 25.0-25.9, adult: Secondary | ICD-10-CM

## 2022-02-07 DIAGNOSIS — E861 Hypovolemia: Secondary | ICD-10-CM | POA: Diagnosis present

## 2022-02-07 DIAGNOSIS — R627 Adult failure to thrive: Secondary | ICD-10-CM | POA: Diagnosis present

## 2022-02-07 DIAGNOSIS — R17 Unspecified jaundice: Secondary | ICD-10-CM | POA: Diagnosis not present

## 2022-02-07 DIAGNOSIS — R0689 Other abnormalities of breathing: Secondary | ICD-10-CM | POA: Diagnosis not present

## 2022-02-07 LAB — CBC
HCT: 45.4 % (ref 39.0–52.0)
Hemoglobin: 13.9 g/dL (ref 13.0–17.0)
MCH: 28.3 pg (ref 26.0–34.0)
MCHC: 30.6 g/dL (ref 30.0–36.0)
MCV: 92.3 fL (ref 80.0–100.0)
Platelets: 435 10*3/uL — ABNORMAL HIGH (ref 150–400)
RBC: 4.92 MIL/uL (ref 4.22–5.81)
RDW: 14 % (ref 11.5–15.5)
WBC: 19.1 10*3/uL — ABNORMAL HIGH (ref 4.0–10.5)
nRBC: 0 % (ref 0.0–0.2)

## 2022-02-07 LAB — COMPREHENSIVE METABOLIC PANEL
ALT: 46 U/L — ABNORMAL HIGH (ref 0–44)
AST: 55 U/L — ABNORMAL HIGH (ref 15–41)
Albumin: 3.8 g/dL (ref 3.5–5.0)
Alkaline Phosphatase: 89 U/L (ref 38–126)
Anion gap: 25 — ABNORMAL HIGH (ref 5–15)
BUN: 17 mg/dL (ref 8–23)
CO2: 11 mmol/L — ABNORMAL LOW (ref 22–32)
Calcium: 9.4 mg/dL (ref 8.9–10.3)
Chloride: 104 mmol/L (ref 98–111)
Creatinine, Ser: 1.72 mg/dL — ABNORMAL HIGH (ref 0.61–1.24)
GFR, Estimated: 45 mL/min — ABNORMAL LOW (ref >60)
Glucose, Bld: 249 mg/dL — ABNORMAL HIGH (ref 70–99)
Potassium: 3.5 mmol/L (ref 3.5–5.1)
Sodium: 140 mmol/L (ref 135–145)
Total Bilirubin: 2.3 mg/dL — ABNORMAL HIGH (ref 0.3–1.2)
Total Protein: 7.1 g/dL (ref 6.5–8.1)

## 2022-02-07 LAB — I-STAT ARTERIAL BLOOD GAS, ED
Acid-Base Excess: 0 mmol/L (ref 0.0–2.0)
Bicarbonate: 24.5 mmol/L (ref 20.0–28.0)
Calcium, Ion: 1.18 mmol/L (ref 1.15–1.40)
HCT: 38 % — ABNORMAL LOW (ref 39.0–52.0)
Hemoglobin: 12.9 g/dL — ABNORMAL LOW (ref 13.0–17.0)
O2 Saturation: 100 %
Patient temperature: 97.2
Potassium: 3.1 mmol/L — ABNORMAL LOW (ref 3.5–5.1)
Sodium: 137 mmol/L (ref 135–145)
TCO2: 26 mmol/L (ref 22–32)
pCO2 arterial: 35.4 mmHg (ref 32–48)
pH, Arterial: 7.445 (ref 7.35–7.45)
pO2, Arterial: 546 mmHg — ABNORMAL HIGH (ref 83–108)

## 2022-02-07 LAB — APTT: aPTT: 21 seconds — ABNORMAL LOW (ref 24–36)

## 2022-02-07 LAB — ETHANOL: Alcohol, Ethyl (B): <10 mg/dL (ref <10)

## 2022-02-07 LAB — VALPROIC ACID LEVEL: Valproic Acid Lvl: <10 ug/mL — ABNORMAL LOW (ref 50.0–100.0)

## 2022-02-07 LAB — PROTIME-INR
INR: 1.2 (ref 0.8–1.2)
Prothrombin Time: 15.1 seconds (ref 11.4–15.2)

## 2022-02-07 LAB — RAPID URINE DRUG SCREEN, HOSP PERFORMED
Amphetamines: POSITIVE — AB
Barbiturates: NOT DETECTED
Benzodiazepines: POSITIVE — AB
Cocaine: NOT DETECTED
Opiates: NOT DETECTED
Tetrahydrocannabinol: POSITIVE — AB

## 2022-02-07 LAB — DIFFERENTIAL
Abs Immature Granulocytes: 0.08 10*3/uL — ABNORMAL HIGH (ref 0.00–0.07)
Basophils Absolute: 0.1 10*3/uL (ref 0.0–0.1)
Basophils Relative: 1 %
Eosinophils Absolute: 0.1 10*3/uL (ref 0.0–0.5)
Eosinophils Relative: 1 %
Immature Granulocytes: 0 %
Lymphocytes Relative: 30 %
Lymphs Abs: 5.8 10*3/uL — ABNORMAL HIGH (ref 0.7–4.0)
Monocytes Absolute: 0.9 10*3/uL (ref 0.1–1.0)
Monocytes Relative: 5 %
Neutro Abs: 12.2 10*3/uL — ABNORMAL HIGH (ref 1.7–7.7)
Neutrophils Relative %: 63 %
WBC Morphology: >10

## 2022-02-07 LAB — URINALYSIS, ROUTINE W REFLEX MICROSCOPIC
Bacteria, UA: NONE SEEN
Bilirubin Urine: NEGATIVE
Glucose, UA: NEGATIVE mg/dL
Hgb urine dipstick: NEGATIVE
Ketones, ur: 5 mg/dL — AB
Leukocytes,Ua: NEGATIVE
Nitrite: NEGATIVE
Protein, ur: 30 mg/dL — AB
Specific Gravity, Urine: 1.02 (ref 1.005–1.030)
pH: 5 (ref 5.0–8.0)

## 2022-02-07 LAB — I-STAT CHEM 8, ED
BUN: 18 mg/dL (ref 8–23)
Calcium, Ion: 1.03 mmol/L — ABNORMAL LOW (ref 1.15–1.40)
Chloride: 108 mmol/L (ref 98–111)
Creatinine, Ser: 1.4 mg/dL — ABNORMAL HIGH (ref 0.61–1.24)
Glucose, Bld: 246 mg/dL — ABNORMAL HIGH (ref 70–99)
HCT: 44 % (ref 39.0–52.0)
Hemoglobin: 15 g/dL (ref 13.0–17.0)
Potassium: 3.1 mmol/L — ABNORMAL LOW (ref 3.5–5.1)
Sodium: 139 mmol/L (ref 135–145)
TCO2: 13 mmol/L — ABNORMAL LOW (ref 22–32)

## 2022-02-07 MED ORDER — PROPOFOL 1000 MG/100ML IV EMUL
0.0000 ug/kg/min | INTRAVENOUS | Status: DC
  Administered 2022-02-07: 5 ug/kg/min via INTRAVENOUS
  Administered 2022-02-08: 25 ug/kg/min via INTRAVENOUS
  Administered 2022-02-08 (×5): 50 ug/kg/min via INTRAVENOUS
  Administered 2022-02-08: 30 ug/kg/min via INTRAVENOUS
  Administered 2022-02-09 (×2): 40 ug/kg/min via INTRAVENOUS
  Administered 2022-02-09: 50 ug/kg/min via INTRAVENOUS
  Administered 2022-02-09: 40 ug/kg/min via INTRAVENOUS
  Administered 2022-02-09 (×2): 50 ug/kg/min via INTRAVENOUS
  Administered 2022-02-10: 40 ug/kg/min via INTRAVENOUS
  Administered 2022-02-10: 30 ug/kg/min via INTRAVENOUS
  Filled 2022-02-07: qty 200
  Filled 2022-02-07 (×7): qty 100
  Filled 2022-02-07: qty 200
  Filled 2022-02-07 (×5): qty 100

## 2022-02-07 MED ORDER — DOCUSATE SODIUM 50 MG/5ML PO LIQD
100.0000 mg | Freq: Two times a day (BID) | ORAL | Status: DC
  Administered 2022-02-08 – 2022-02-10 (×5): 100 mg
  Filled 2022-02-07 (×5): qty 10

## 2022-02-07 MED ORDER — SODIUM CHLORIDE 0.9 % IV SOLN
2.0000 g | Freq: Once | INTRAVENOUS | Status: AC
  Administered 2022-02-08: 2 g via INTRAVENOUS
  Filled 2022-02-07: qty 20

## 2022-02-07 MED ORDER — THIAMINE HCL 100 MG/ML IJ SOLN
500.0000 mg | Freq: Three times a day (TID) | INTRAVENOUS | Status: DC
  Administered 2022-02-08: 500 mg via INTRAVENOUS
  Filled 2022-02-07 (×7): qty 5

## 2022-02-07 MED ORDER — MIDAZOLAM HCL 2 MG/2ML IJ SOLN
4.0000 mg | Freq: Once | INTRAMUSCULAR | Status: AC
  Administered 2022-02-07: 4 mg via INTRAVENOUS
  Filled 2022-02-07: qty 4

## 2022-02-07 MED ORDER — POLYETHYLENE GLYCOL 3350 17 G PO PACK
17.0000 g | PACK | Freq: Every day | ORAL | Status: DC
  Administered 2022-02-08 – 2022-02-10 (×3): 17 g
  Filled 2022-02-07 (×3): qty 1

## 2022-02-07 MED ORDER — IOHEXOL 350 MG/ML SOLN
100.0000 mL | Freq: Once | INTRAVENOUS | Status: AC | PRN
  Administered 2022-02-07: 100 mL via INTRAVENOUS

## 2022-02-07 MED ORDER — POTASSIUM CHLORIDE 10 MEQ/100ML IV SOLN
10.0000 meq | Freq: Once | INTRAVENOUS | Status: AC
  Administered 2022-02-07: 10 meq via INTRAVENOUS
  Filled 2022-02-07: qty 100

## 2022-02-07 MED ORDER — FENTANYL 2500MCG IN NS 250ML (10MCG/ML) PREMIX INFUSION
0.0000 ug/h | INTRAVENOUS | Status: DC
  Administered 2022-02-07: 25 ug/h via INTRAVENOUS
  Administered 2022-02-08: 200 ug/h via INTRAVENOUS
  Administered 2022-02-09: 100 ug/h via INTRAVENOUS
  Filled 2022-02-07 (×3): qty 250

## 2022-02-07 MED ORDER — DILTIAZEM LOAD VIA INFUSION
10.0000 mg | Freq: Once | INTRAVENOUS | Status: AC
  Administered 2022-02-07: 10 mg via INTRAVENOUS
  Filled 2022-02-07: qty 10

## 2022-02-07 MED ORDER — THIAMINE HCL 100 MG/ML IJ SOLN: 100.0000 mg | Freq: Every day | INTRAMUSCULAR | Status: DC

## 2022-02-07 MED ORDER — DILTIAZEM HCL-DEXTROSE 125-5 MG/125ML-% IV SOLN (PREMIX)
5.0000 mg/h | INTRAVENOUS | Status: DC
  Administered 2022-02-07: 5 mg/h via INTRAVENOUS
  Filled 2022-02-07: qty 125

## 2022-02-07 MED ORDER — MIDAZOLAM-SODIUM CHLORIDE 100-0.9 MG/100ML-% IV SOLN
0.5000 mg/h | INTRAVENOUS | Status: DC
  Administered 2022-02-07: 2 mg/h via INTRAVENOUS
  Filled 2022-02-07: qty 100

## 2022-02-07 MED ORDER — LEVETIRACETAM IN NACL 1500 MG/100ML IV SOLN
1500.0000 mg | Freq: Once | INTRAVENOUS | Status: AC
  Administered 2022-02-07: 1500 mg via INTRAVENOUS
  Filled 2022-02-07: qty 100

## 2022-02-07 MED ORDER — VANCOMYCIN HCL IN DEXTROSE 1-5 GM/200ML-% IV SOLN
1000.0000 mg | Freq: Once | INTRAVENOUS | Status: AC
  Administered 2022-02-08: 1000 mg via INTRAVENOUS
  Filled 2022-02-07: qty 200

## 2022-02-07 MED ORDER — PROPOFOL 10 MG/ML IV BOLUS
50.0000 mg | Freq: Once | INTRAVENOUS | Status: AC
  Administered 2022-02-07: 50 mg via INTRAVENOUS

## 2022-02-07 MED ORDER — FENTANYL CITRATE PF 50 MCG/ML IJ SOSY
50.0000 ug | PREFILLED_SYRINGE | Freq: Once | INTRAMUSCULAR | Status: AC
  Administered 2022-02-07: 50 ug via INTRAVENOUS
  Filled 2022-02-07: qty 1

## 2022-02-07 MED ORDER — THIAMINE HCL 100 MG/ML IJ SOLN: 250.0000 mg | Freq: Every day | INTRAVENOUS | Status: DC

## 2022-02-07 MED ORDER — PROPOFOL 1000 MG/100ML IV EMUL
INTRAVENOUS | Status: AC
  Filled 2022-02-07: qty 100

## 2022-02-07 MED ORDER — ETOMIDATE 2 MG/ML IV SOLN
INTRAVENOUS | Status: AC | PRN
  Administered 2022-02-07: 20 mg via INTRAVENOUS

## 2022-02-07 NOTE — ED Notes (Addendum)
Pt intubated at this time. Given 100mg  of succinylcholine at 2125. 7.5 ETT inserted by 2126, MD @ 2128. ETT 23 @ lip. 100mg  of rocuronium given @ 2140 to prevent pt from pulling ETT tube.

## 2022-02-07 NOTE — ED Notes (Signed)
Pt taken to CT scan at this time.

## 2022-02-07 NOTE — ED Provider Notes (Signed)
[facility] EMERGENCY DEPARTMENT Provider Note   CSN: [id number] Arrival date & time: 02/07/22  2104  An emergency department physician performed an initial assessment on this suspected stroke patient at 2104.  History  Chief Complaint  Patient presents with   Altered Mental Status    Justin Tanner is a 61 y.o. male.  With past medical history significant for A-fib, CAD, HTN presenting via EMS with concerns for altered mental status.  EMS reports that they were called by the patient's sister for irregular behavior.  On their arrival, patient was in A-fib with RVR.  He was given a total of 20 mg of diltiazem.  In route, patient had acute change in mental status followed by generalized shaking concerning for seizure.  He was given benzodiazepines by EMS.  He became unresponsive and required BVM.  We were able to speak to the patient's sister on telephone.  She states that she is unsure when he was last normal.  She last spoke to him a couple of days ago before today.  She stated that he walked over to her house and when he came in she could not understand any thing that he was saying.  She notes that his wife died recently and that she is concerned that he is "getting into some questionable stuff."  She does think that he would want interventions including CPR and intubation.  She is unsure of his past medical history.      Home Medications Prior to Admission medications   Medication Sig Start Date End Date Taking? Authorizing Provider  aspirin EC 81 MG tablet Take 81 mg by mouth daily.    [provider]  citalopram (CELEXA) 40 MG tablet Take 40 mg by mouth daily.    [provider]  gabapentin (NEURONTIN) 600 MG tablet Take 600 mg by mouth 3 (three) times daily. 11/27/16   [provider]  gemfibrozil (LOPID) 600 MG tablet Take 600 mg by mouth 2 (two) times daily. 11/19/16   [provider]  lisinopril-hydrochlorothiazide  (PRINZIDE,ZESTORETIC) 10-12.5 MG per tablet Take 1 tablet by mouth daily.    [provider]  metoprolol succinate (TOPROL-XL) 100 MG 24 hr tablet Take 100 mg by mouth daily. Take with or immediately following a meal.    [provider]  nitroGLYCERIN (NITROSTAT) 0.4 MG SL tablet Place 1 tablet (0.4 mg total) under the tongue every 5 (five) minutes as needed for chest pain. 12/17/16 03/17/17  Revankar, Reita Cliche, MD  pramipexole (MIRAPEX) 0.25 MG tablet Take 0.25 mg by mouth daily as needed. 12/16/16   [provider]  pravastatin (PRAVACHOL) 40 MG tablet Take 40 mg by mouth at bedtime.    [provider]  XARELTO 15 MG TABS tablet Take 1 tablet (15 mg total) by mouth daily with supper. Start this new dose once given the okay after the heart catheterization 03/12/17   Revankar, Reita Cliche, MD      Allergies    Patient has no known allergies.    Review of Systems   Review of Systems  Physical Exam Updated Vital Signs BP 124/86   Pulse 71   Temp (!) 97.5 F (36.4 C)   Resp 19   Ht 6' (1.829 m)   Wt 104 kg   SpO2 96%   BMI 31.10 kg/m  Physical Exam Constitutional:      General: He is in acute distress.     Appearance: He is ill-appearing and diaphoretic.  Comments: Withdraws to painful stimulus  HENT:     Nose:     Comments: Blood from right nare, slight abrasion on bridge of nose, no overt bruising, no nasal septal hematoma.  Midface stable    Mouth/Throat:     Comments: Edentulous, no oral trauma, no mucosal lesions intraorally Eyes:     Conjunctiva/sclera: Conjunctivae normal.     Pupils: Pupils are equal, round, and reactive to light.     Comments: Does not cooperate with extraocular movement exam, no gaze deviation appreciated  Cardiovascular:     Rate and Rhythm: Tachycardia present. Rhythm irregular.     Comments: Strong DP bilaterally, strong radial pulse bilaterally Pulmonary:     Effort: Pulmonary effort is normal.     Comments:  Decreased breath sounds throughout Abdominal:     General: There is no distension.     Palpations: There is no mass.  Musculoskeletal:     Cervical back: No rigidity.     Right lower leg: No edema.     Left lower leg: No edema.  Skin:    Coloration: Skin is not jaundiced.     Findings: No rash.     ED Results / Procedures / Treatments   Labs (all labs ordered are listed, but only abnormal results are displayed) Labs Reviewed  APTT - Abnormal; Notable for the following components:      Result Value   aPTT 21 (*)    All other components within normal limits  CBC - Abnormal; Notable for the following components:   WBC 19.1 (*)    Platelets 435 (*)    All other components within normal limits  DIFFERENTIAL - Abnormal; Notable for the following components:   Neutro Abs 12.2 (*)    Lymphs Abs 5.8 (*)    Abs Immature Granulocytes 0.08 (*)    All other components within normal limits  COMPREHENSIVE METABOLIC PANEL - Abnormal; Notable for the following components:   CO2 11 (*)    Glucose, Bld 249 (*)    Creatinine, Ser 1.72 (*)    AST 55 (*)    ALT 46 (*)    Total Bilirubin 2.3 (*)    GFR, Estimated 45 (*)    Anion gap 25 (*)    All other components within normal limits  RAPID URINE DRUG SCREEN, HOSP PERFORMED - Abnormal; Notable for the following components:   Benzodiazepines POSITIVE (*)    Amphetamines POSITIVE (*)    Tetrahydrocannabinol POSITIVE (*)    All other components within normal limits  URINALYSIS, ROUTINE W REFLEX MICROSCOPIC - Abnormal; Notable for the following components:   Color, Urine AMBER (*)    APPearance HAZY (*)    Ketones, ur 5 (*)    Protein, ur 30 (*)    All other components within normal limits  VALPROIC ACID LEVEL - Abnormal; Notable for the following components:   Valproic Acid Lvl <10 (*)    All other components within normal limits  I-STAT CHEM 8, ED - Abnormal; Notable for the following components:   Potassium 3.1 (*)    Creatinine, Ser  1.40 (*)    Glucose, Bld 246 (*)    Calcium, Ion 1.03 (*)    TCO2 13 (*)    All other components within normal limits  I-STAT ARTERIAL BLOOD GAS, ED - Abnormal; Notable for the following components:   pO2, Arterial 546 (*)    Potassium 3.1 (*)    HCT 38.0 (*)    Hemoglobin  12.9 (*)    All other components within normal limits  ETHANOL  PROTIME-INR  TRIGLYCERIDES  SALICYLATE LEVEL  ACETAMINOPHEN LEVEL  AMMONIA  I-STAT CHEM 8, ED  TROPONIN I (HIGH SENSITIVITY)    EKG EKG Interpretation  Date/Time:  Friday February 07 2022 21:13:31 EST Ventricular Rate:  139 PR Interval:    QRS Duration: 105 QT Interval:  293 QTC Calculation: 446 R Axis:   69 Text Interpretation: Atrial fibrillation Repol abnrm suggests ischemia, diffuse leads rapid afib new since previous Confirmed by Wandra Arthurs [phone]) on 02/07/2022 11:14:31 PM  Radiology CT ANGIO HEAD NECK W WO CM W PERF (CODE STROKE)  Result Date: 02/07/2022 CLINICAL DATA:  Stroke suspected, altered mental status EXAM: CT ANGIOGRAPHY HEAD AND NECK CT PERFUSION BRAIN TECHNIQUE: Multidetector CT imaging of the head and neck was performed using the standard protocol during bolus administration of intravenous contrast. Multiplanar CT image reconstructions and MIPs were obtained to evaluate the vascular anatomy. Carotid stenosis measurements (when applicable) are obtained utilizing NASCET criteria, using the distal internal carotid diameter as the denominator. Multiphase CT imaging of the brain was performed following IV bolus contrast injection. Subsequent parametric perfusion maps were calculated using RAPID software. RADIATION DOSE REDUCTION: This exam was performed according to the departmental dose-optimization program which includes automated exposure control, adjustment of the mA and/or kV according to patient size and/or use of iterative reconstruction technique. CONTRAST:  135m OMNIPAQUE IOHEXOL 350 MG/ML SOLN COMPARISON:  No prior CTA,  correlation is made with 02/07/2022 CT head FINDINGS: CT HEAD FINDINGS For noncontrast findings, please see same day CT head. CTA NECK FINDINGS Aortic arch: Standard branching. Imaged portion shows no evidence of aneurysm or dissection. No significant stenosis of the major arch vessel origins. Aortic atherosclerosis. Right carotid system: No evidence of stenosis, dissection, or occlusion. Left carotid system: No evidence of stenosis, dissection, or occlusion. Vertebral arteries: No evidence of stenosis, dissection, or occlusion. Skeleton: No acute osseous abnormality. Edentulous. Degenerative changes in the cervical spine. Status post ACDF C6-C7. Other neck: Endotracheal and orogastric tubes noted. No additional acute finding in the neck. Upper chest: No focal pulmonary opacity or pleural effusion. Mild centrilobular emphysema. Review of the MIP images confirms the above findings CTA HEAD FINDINGS Anterior circulation: Both internal carotid arteries are patent to the termini, without significant stenosis. A1 segments patent. Normal anterior communicating artery. Anterior cerebral arteries are patent to their distal aspects. No M1 stenosis or occlusion. MCA branches perfused, with somewhat diminished enhancement in the anterior right MCA branches compared to the left (series 8, image 79), although no focal stenosis is seen. Posterior circulation: Vertebral arteries patent to the vertebrobasilar junction without stenosis. Posterior inferior cerebellar arteries patent proximally. Basilar patent to its distal aspect. Superior cerebellar arteries patent proximally. Patent P1 segments. PCAs perfused to their distal aspects without stenosis. The right posterior communicating artery is not visualized. Venous sinuses: As permitted by contrast timing, patent. Anatomic variants: None significant. Review of the MIP images confirms the above findings CT Brain Perfusion Findings: ASPECTS: 10 CBF (<30%) Volume: 0623mPerfusion  (Tmax>6.0s) volume: 23m48mismatch Volume: 23mL57mfarction Location:None IMPRESSION: 1. No intracranial large vessel occlusion or significant stenosis. Somewhat diminished enhancement in the anterior right MCA branches compared to the left, although no focal stenosis is seen. 2. No hemodynamically significant stenosis in the neck. 3. No evidence of core infarct or penumbra on CT perfusion. 4. Aortic atherosclerosis. 5. Emphysema. Aortic Atherosclerosis (ICD10-I70.0) and Emphysema (ICD10-J43.9). Electronically Signed   By:  Merilyn Baba M.D.   On: 02/07/2022 22:29   DG Chest Port 1 View  Result Date: 02/07/2022 CLINICAL DATA:  Status post intubation. EXAM: PORTABLE CHEST 1 VIEW COMPARISON:  None Available. FINDINGS: The heart size and mediastinal contours are within normal limits. Both lungs are clear. No acute osseous abnormality. Endotracheal tube approximately 5.5 cm above the carina. Feeding tube coursing below the diaphragm with distal tip projecting over the gastric fundus. IMPRESSION: 1. Endotracheal tube approximately 5.5 cm above the carina. 2. Feeding tube coursing below the diaphragm with distal tip projecting over the gastric fundus. Electronically Signed   By: Keane Police D.O.   On: 02/07/2022 22:10   CT HEAD CODE STROKE WO CONTRAST  Result Date: 02/07/2022 CLINICAL DATA:  Code stroke.  Unresponsive EXAM: CT HEAD WITHOUT CONTRAST TECHNIQUE: Contiguous axial images were obtained from the base of the skull through the vertex without intravenous contrast. RADIATION DOSE REDUCTION: This exam was performed according to the departmental dose-optimization program which includes automated exposure control, adjustment of the mA and/or kV according to patient size and/or use of iterative reconstruction technique. COMPARISON:  07/02/2013 CT head with contrast FINDINGS: Brain: No evidence of acute infarction, hemorrhage, cerebral edema, mass, mass effect, or midline shift. No hydrocephalus or extra-axial  collection. Vascular: No hyperdense vessel. Skull: Negative for fracture or focal lesion. Sinuses/Orbits: Mucosal thickening in the ethmoid air cells and sphenoid sinuses. Mucous retention cysts in the maxillary sinuses. The orbits are unremarkable. Other: The mastoid air cells are well aerated. ASPECTS [facility] Stroke Program Early CT Score) - Ganglionic level infarction (caudate, lentiform nuclei, internal capsule, insula, M1-M3 cortex): 7 - Supraganglionic infarction (M4-M6 cortex): 3 Total score (0-10 with 10 being normal): 10 IMPRESSION: 1. No acute intracranial process. 2. ASPECTS is 10. Code stroke imaging results were communicated on 02/07/2022 at 10:07 pm to provider Dr. Lorrin Goodell via secure text paging. Electronically Signed   By: Merilyn Baba M.D.   On: 02/07/2022 22:08    Procedures .Cardioversion  Date/Time: 02/07/2022 9:54 PM  Performed by: Luster Landsberg, MD Authorized by: Drenda Freeze, MD   Consent:    Consent obtained:  Emergent situation Pre-procedure details:    Cardioversion basis:  Emergent   Rhythm:  Atrial fibrillation   Electrode placement:  Anterior-posterior Patient sedated: Yes. Refer to sedation procedure documentation for details of sedation.  Attempt one:    Cardioversion mode:  Synchronous   Shock (Joules):  200   Shock outcome:  Conversion to normal sinus rhythm Post-procedure details:    Patient status:  Unresponsive   Patient tolerance of procedure:  Tolerated well, no immediate complications Comments:     Patient was successfully converted to sinus rhythm, however shortly after conversion patient had recurrence of A-fib with RVR.  Repeat cardioversion deferred at this time as patient is no longer hypotensive.  Procedure Name: Intubation Date/Time: 02/07/2022 9:57 PM  Performed by: Luster Landsberg, MDPre-anesthesia Checklist: Patient identified, Emergency Drugs available, Suction available, Patient being monitored and Timeout performed Oxygen  Delivery Method: Ambu bag Preoxygenation: Pre-oxygenation with 100% oxygen Induction Type: Rapid sequence Ventilation: Mask ventilation without difficulty Laryngoscope Size: Glidescope, 3 and Mac Grade View: Grade I Tube size: 7.5 mm Number of attempts: 1 Airway Equipment and Method: Rigid stylet Placement Confirmation: ETT inserted through vocal cords under direct vision, Positive ETCO2, CO2 detector and Breath sounds checked- equal and bilateral Secured at: 23 cm Tube secured with: ETT holder    .Sedation  Date/Time: 02/07/2022 9:50 PM  Performed  by: Luster Landsberg, MD Authorized by: Drenda Freeze, MD   Consent:    Consent obtained:  Emergent situation Universal protocol:    Immediately prior to procedure, a time out was called: yes     Patient identity confirmed:  Arm band Indications:    Procedure performed:  Cardioversion Pre-sedation assessment:    Time since last food or drink:  Unable to specify - patient with altered mental status   ASA classification: class 2 - patient with mild systemic disease     Mouth opening:  3 or more finger widths   Thyromental distance:  3 finger widths   Mallampati score:  II - soft palate, uvula, fauces visible   Neck mobility: normal     Pre-sedation assessments completed and reviewed: airway patency, cardiovascular function, mental status, respiratory function and temperature     Pre-sedation assessments completed and reviewed: pre-procedure hydration status not reviewed, pre-procedure nausea and vomiting status not reviewed and pre-procedure pain level not reviewed     Pre-sedation assessments completed and reviewed comment:  Patient with altered mental status   Pre-sedation assessment completed:  02/07/2022 9:50 PM Immediate pre-procedure details:    Reassessment: Patient reassessed immediately prior to procedure     Reviewed: vital signs     Verified: bag valve mask available, emergency equipment available, intubation equipment  available, IV patency confirmed, oxygen available, reversal medications available and suction available   Procedure details (see MAR for exact dosages):    Preoxygenation:  Nasal cannula   Sedation:  Etomidate   Intended level of sedation: moderate (conscious sedation)   Analgesia:  None   Intra-procedure monitoring:  Blood pressure monitoring, cardiac monitor, continuous capnometry, continuous pulse oximetry, frequent LOC assessments and frequent vital sign checks   Intra-procedure events: none     Total Provider sedation time (minutes):  5     Medications Ordered in ED Medications  docusate (COLACE) 50 MG/5ML liquid 100 mg (has no administration in time range)  polyethylene glycol (MIRALAX / GLYCOLAX) packet 17 g (has no administration in time range)  propofol (DIPRIVAN) 1000 MG/100ML infusion (50 mcg/kg/min  104 kg Intravenous Rate/Dose Change 02/07/22 2310)  diltiazem (CARDIZEM) 1 mg/mL load via infusion 10 mg (10 mg Intravenous Bolus from Bag 02/07/22 2234)    And  diltiazem (CARDIZEM) 125 mg in dextrose 5% 125 mL (1 mg/mL) infusion (5 mg/hr Intravenous New Bag/Given 02/07/22 2233)  potassium chloride 10 mEq in 100 mL IVPB (10 mEq Intravenous New Bag/Given 02/07/22 2246)  fentaNYL 2563mg in NS 257m(1036mml) infusion-PREMIX (has no administration in time range)  midazolam (VERSED) 100 mg/100 mL (1 mg/mL) premix infusion (has no administration in time range)  levETIRAcetam (KEPPRA) IVPB 1500 mg/ 100 mL premix (0 mg Intravenous Stopped 02/07/22 2241)  fentaNYL (SUBLIMAZE) injection 50 mcg (50 mcg Intravenous Given 02/07/22 2140)  midazolam (VERSED) injection 4 mg (4 mg Intravenous Given 02/07/22 2140)  iohexol (OMNIPAQUE) 350 MG/ML injection 100 mL (100 mLs Intravenous Contrast Given 02/07/22 2217)  propofol (DIPRIVAN) 10 mg/mL bolus/IV push 50 mg (50 mg Intravenous Given 02/07/22 2301)    ED Course/ Medical Decision Making/ A&P                           Medical Decision  Making On initial arrival, patient was GCS-8.  He was breathing on his own, oxygenating well on room air.  He was in A-fib with RVR, rates in the 140s, and hypotensive.  He did  have intermittent slight improvement in mental status and would open eyes to voice, answer "what" to all questions, however he then returned to responding only to painful stimulus.  Due to hypotension and A-fib, emergent cardioversion merited.  He also has low GCS and has intermittent snoring respirations.  Plan to sedate with etomidate, cardiovert, and intubate.  Plan for cardioversion prior to intubation to help support blood pressure.  Please see procedure notes above.  Differential at this time remains quite broad and includes metabolic encephalopathy, traumatic injury leading to intracranial hemorrhage, ingestion, intoxication.  At this time, patient is afebrile, his sister reported concern for some ingestion, I do not think this is sepsis.  CVA would be possible in the setting of A-fib and unknown anticoagulation status.  Due to concern for CVA, code stroke initiated.  Neurology arrived at bedside, and patient was taken to [facility].  Imaging not consistent with acute stroke.  Patient did have reported seizure activity from his sister and EMS.  He will require EEG.    Amount and/or Complexity of Data Reviewed Independent Historian: friend and EMS Labs: ordered. Decision-making details documented in ED Course. Radiology: ordered and independent interpretation performed. Decision-making details documented in ED Course. ECG/medicine tests: ordered and independent interpretation performed. Decision-making details documented in ED Course.  Risk OTC drugs. Prescription drug management. Decision regarding hospitalization.   Patient's EKG consistent with A-fib with RVR.  I-STAT CHEM with slightly low potassium at 3.1.  Repletion ordered.  ABG showed normal pH, normal PCO2, elevated PaO2.  UDS positive for benzodiazepines,  amphetamines, THC.  CBC notable for leukocytosis with elevated neutrophils and elevated lymphocytes.  CMP notable for slightly elevated LFTs and bilirubin, anion gap 25, creatinine 1.72 and BUN normal.  Patient's bicarb was low at 11.  Keppra load given.  Propofol drip for sedation.  Patient is having difficulty with sedation.  Versed and fentanyl drips initiated.  Patient's sister arrived at bedside.  She was able to further elaborate on some of the patient's history including concern for increasing recent drug use.  She reports that she is concerned that he has been using methamphetamines.  She states that he will stay awake for multiple days in a row.  This increases my concern for methamphetamine toxicity.  I discussed patient with critical care.  They request empiric meningitic coverage.  This was ordered.  On reevaluation the patient, he continued to have significant agitation requiring up titration of sedation.  Will defer LP until patient is better sedated.  Patient will be admitted to the ICU.   CRITICAL CARE Performed by: Luster Landsberg under the supervision of my attending, Shirlyn Goltz, MD   Total critical care time: 38 minutes  Critical care time was exclusive of separately billable procedures and treating other patients.  Critical care was necessary to treat or prevent imminent or life-threatening deterioration.  Critical care was time spent personally by me on the following activities: development of treatment plan with patient and/or surrogate as well as nursing, discussions with consultants, evaluation of patient's response to treatment, examination of patient, obtaining history from patient or surrogate, ordering and performing treatments and interventions, ordering and review of laboratory studies, ordering and review of radiographic studies, pulse oximetry and re-evaluation of patient's condition.        Final Clinical Impression(s) / ED Diagnoses Final diagnoses:   Altered mental status, unspecified altered mental status type  Seizure ([place])  Atrial fibrillation, unspecified type ([place])  Hypotension, unspecified hypotension type  Rx / DC Orders ED Discharge Orders     None         Luster Landsberg, MD 02/08/22 0014    Drenda Freeze, MD 02/08/22 1723

## 2022-02-07 NOTE — ED Triage Notes (Signed)
Pt BIB EMS. Per EMS, pt exhibited stroke like symptoms at home with unknown LKW. Pt in Afib RVR with EMS arrival. 20mg  total of cardizem given. Pt currently unresponsive.

## 2022-02-07 NOTE — Progress Notes (Signed)
LTM EEG hooked up and running - no initial skin breakdown - push button tested - No [facility] at this time. Patient is currently in the ED.

## 2022-02-07 NOTE — ED Notes (Signed)
Conscious sedation performed at this time for synchronized cardioversion. Silverio Lay, MD and Louellen Molder, MD at bedside. 20mg  of etomidate given at 2123. Synchronized cardioversion performed by 2124, MD with 200J of voltage at 2124. Pt briefly converted from Afib RVR to sinus tach.

## 2022-02-07 NOTE — H&P (Signed)
NAME:  Justin Tanner, MRN:  [id number], DOB:  [birth year], LOS: 0 ADMISSION DATE:  02/07/2022, CONSULTATION DATE:  02/07/2022 REFERRING MD:  Silverio Lay , CHIEF COMPLAINT: Altered mental status  History of Present Illness:  Patient is encephalopathic and/or intubated. Therefore history has been obtained from chart review and   Justin Tanner, is a 61 y.o. male, who presented to the [facility] ED with a chief complaint of altered mental status  They have a pertinent past medical history of polysubstance abuse, CAD, HLD, HTN, Afib  Per sister patient has been increasingly reclusive at home.  Lost 60 to 70 pounds over the past few months.  Reportedly "slept the whole month of October."  Reportedly been using meth with increasing frequency.  On 11/24 patient walks to sisters house and presented with aphasia, which progressed to the seizure-like activity.  EMS was called.  ED course was notable for activation of a code stroke.  CT head negative for LVO or acute changes. EEG was started.  UDS positive for amphetamines, THC.  Patient was found to be in A-fib.  Cardioversion was attempted with conversion to sinus rhythm but conversion back to A-fib.  He was intubated post cardioversion. Keppra load given. Afebrile. No documented hypotension. WBC 19.1. Empiric meningitis coverage started by neuro.  PCCM was consulted for admission.   Pertinent  Medical History  CAD, HLD, HTN, Afib  Significant Hospital Events: Including procedures, antibiotic start and stop dates in addition to other pertinent events   11/24 Admit. PCCM consult. EEG>, CTH neg>, Intubated>, Vanc/Ceftriaxone>  Interim History / Subjective:  See above  Dilt 5mg , prop, 50, fent 50, versed 3  Unable to obtain subjective evaluation due to patient status   Objective   Blood pressure 124/86, pulse 71, temperature (!) 97.5 F (36.4 C), resp. rate 19, height 6' (1.829 m), weight 104 kg, SpO2 96 %.    Vent Mode: PRVC FiO2 (%):  [100 %]  100 % Set Rate:  [18 bmp] 18 bmp Vt Set:  [620 mL] 620 mL PEEP:  [5 cmH20] 5 cmH20 Plateau Pressure:  [12 cmH20] 12 cmH20  No intake or output data in the 24 hours ending 02/07/22 2316 Filed Weights   02/07/22 2111  Weight: 104 kg    Examination: General: In bed, NAD, restless, mildy diaphoretic HEENT: MM pink/moist, anicteric, atraumatic Neuro: RASS -2, PERRL 50mm, sedated, MAR CV: S1S2, Afib, no m/r/g appreciated PULM:  air movement in all lobes, trachea midline, chest expansion symmetric GI: soft, bsx4 hypoactive, non-tender   Extremities: warm/dry, no pretibial edema, capillary refill less than 3 seconds  Skin:  no rashes or lesions noted  Ethanol WNL INR 1.2 WBC 19.1,  PLT 435 NA 140 Glucose 249 Creat 1.92 (baseline 0.9-1) AST 55, ALT 46, bili 2.3 Valproic acid low 7.44/35/546/24 UDS+ for THC, amphetatmines, benzos CT head negative for acute process 12 lead: afib with RVR  Resolved Hospital Problem list     Assessment & Plan:  Seizure, suspect secondary to methamphetamine toxicity Acute metabolic encephalopathy secondary to above Polysubstance abuse UDS prior degenerative for THC, amphetamines.  Patient with severe agitation not controlled by multiple medications. Afebrile. No documented hypotension. WBC 19.1.  Family history supporting increased drug use.  Head CT negative for LVO or acute changes. -Admit to ICU with continuous telemetry and SpO2 monitoring -Neurology following appreciate assistance. -Neurochecks per unit protocol -EEG per neuro. -Given 50 mg of Keppra in the ED.  AEDs per neurology. -Follow-up MRI, synthetic opioid panel,  cocci panel, B12, folate, thiamine, ammonia -Continue empiric meningitis coverage. -Follow fever WBC curve. -Substance abuse counseling when able. Social work consult.  Acute respiratory failure secondary to acute metabolic encephalopathy GCS of 8 in the ED.  Intubated post cardioversion. -LTVV strategy with tidal volumes  of 4-8 cc/kg ideal body weight -Goal plateau pressures less than 30 and driving pressures less than 15 -Wean PEEP/FiO2 for SpO2 92-98% -VAP bundle -Daily SAT and SBT -PAD bundle with Propofol gtt, versed and fentanyl gtt -RASS goal 0 to -1 -Follow intermittent CXR and ABG PRN  A-fib with RVR Hypertension Hyperlipidemia CAD Unclear if patient is still anticoagulation or not.  Per chart review looks like patient had AC stopped in 2019 due to CHA2DS2-VASc of 1.  Suspect A-fib worsened due to methamphetamine use. -Continue diltiazem -Hold home antihypertensives at this time -Continue ASA 81 -Goal MAP 65 -Discussed anticoagulation plan with Dr. Halford Chessman.  AKI Creat 1.92 (baseline 0.9-1).  Suspect prerenal -Ensure renal perfusion. Goal MAP 65 or greater. -Avoid neprotoxic drugs as possible. -Strict I&O's -Follow up AM creatinine -continue foley  Hyperglycemia Glucose 249 -Blood Glucose goal 140-180. -SSI  Transaminitis Bilirubinemia -Supportive care as above -Trend  Suspect malnutrition, unspecified Wife died in 10-17-2022. 60 to 70 pounds weight loss over the past few months.  -RD consult -Monitor for refeedign  Best Practice (right click and "Reselect all SmartList Selections" daily)   Diet/type: NPO DVT prophylaxis: prophylactic heparin  GI prophylaxis: N/A Lines: N/A Foley:  Yes, and it is still needed Code Status:  full code Last date of multidisciplinary goals of care discussion [discussed with sister on 11/24.  Unsure if patient wishes at this time continue full scope of care.]  Labs   CBC: Recent Labs  Lab 02/07/22 2111 02/07/22 2121 02/07/22 2226  WBC 19.1*  --   --   NEUTROABS 12.2*  --   --   HGB 13.9 15.0 12.9*  HCT 45.4 44.0 38.0*  MCV 92.3  --   --   PLT 435*  --   --     Basic Metabolic Panel: Recent Labs  Lab 02/07/22 2111 02/07/22 2121 02/07/22 2226  NA 140 139 137  K 3.5 3.1* 3.1*  CL 104 108  --   CO2 11*  --   --   GLUCOSE 249* 246*   --   BUN 17 18  --   CREATININE 1.72* 1.40*  --   CALCIUM 9.4  --   --    GFR: Estimated Creatinine Clearance: 69.1 mL/min (A) (by C-G formula based on SCr of 1.4 mg/dL (H)). Recent Labs  Lab 02/07/22 2111  WBC 19.1*    Liver Function Tests: Recent Labs  Lab 02/07/22 2111  AST 55*  ALT 46*  ALKPHOS 89  BILITOT 2.3*  PROT 7.1  ALBUMIN 3.8   No results for input(s): "LIPASE", "AMYLASE" in the last 168 hours. No results for input(s): "AMMONIA" in the last 168 hours.  ABG    Component Value Date/Time   PHART 7.445 02/07/2022 2226   PCO2ART 35.4 02/07/2022 2226   PO2ART 546 (H) 02/07/2022 2226   HCO3 24.5 02/07/2022 2226   TCO2 26 02/07/2022 2226   O2SAT 100 02/07/2022 2226     Coagulation Profile: Recent Labs  Lab 02/07/22 2111  INR 1.2    Cardiac Enzymes: No results for input(s): "CKTOTAL", "CKMB", "CKMBINDEX", "TROPONINI" in the last 168 hours.  HbA1C: Hgb A1c MFr Bld  Date/Time Value Ref Range Status  04/09/2012 05:55  AM 6.0 (H) <5.7 % Final    Comment:    (NOTE)                                                                       According to the ADA Clinical Practice Recommendations for 2011, when HbA1c is used as a screening test:  >=6.5%   Diagnostic of Diabetes Mellitus           (if abnormal result is confirmed) 5.7-6.4%   Increased risk of developing Diabetes Mellitus References:Diagnosis and Classification of Diabetes Mellitus,Diabetes [id number] 1):S62-S69 and Standards of Medical Care in         Diabetes - 2011,Diabetes [id number] (Suppl 1):S11-S61.    CBG: No results for input(s): "GLUCAP" in the last 168 hours.  Review of Systems:   Unable to obtain review of systems due to patient status  Past Medical History:  He,  has a past medical history of Arrhythmia, HLD (hyperlipidemia), Hypertension, and Obesity.   Surgical History:   Past Surgical History:  Procedure Laterality Date   LEFT HEART CATH AND CORONARY  ANGIOGRAPHY N/A 12/19/2016   Procedure: LEFT HEART CATH AND CORONARY ANGIOGRAPHY;  Surgeon: Burnell Blanks, MD;  Location: [place] CV LAB;  Service: Cardiovascular;  Laterality: N/A;   LEFT HEART CATHETERIZATION WITH CORONARY ANGIOGRAM N/A 04/04/2012   Procedure: LEFT HEART CATHETERIZATION WITH CORONARY ANGIOGRAM;  Surgeon: Peter M Martinique, MD;  Location: [facility] CATH LAB;  Service: Cardiovascular;  Laterality: N/A;   LUMBAR LAMINECTOMY/DECOMPRESSION MICRODISCECTOMY  04/22/2012   Procedure: LUMBAR LAMINECTOMY/DECOMPRESSION MICRODISCECTOMY 1 LEVEL;  Surgeon: Faythe Ghee, MD;  Location: [facility] NEURO ORS;  Service: Neurosurgery;  Laterality: Right;  Right lumbar four-five microdiskectomy   NECK SURGERY       Social History:   reports that he has quit smoking. He has quit using smokeless tobacco. He reports that he does not drink alcohol and does not use drugs.   Family History:  His family history includes Heart disease in his father and mother; Hypertension in his father.   Allergies No Known Allergies   Home Medications  Prior to Admission medications   Medication Sig Start Date End Date Taking? Authorizing Provider  aspirin EC 81 MG tablet Take 81 mg by mouth daily.    [provider]  citalopram (CELEXA) 40 MG tablet Take 40 mg by mouth daily.    [provider]  gabapentin (NEURONTIN) 600 MG tablet Take 600 mg by mouth 3 (three) times daily. 11/27/16   [provider]  gemfibrozil (LOPID) 600 MG tablet Take 600 mg by mouth 2 (two) times daily. 11/19/16   [provider]  lisinopril-hydrochlorothiazide (PRINZIDE,ZESTORETIC) 10-12.5 MG per tablet Take 1 tablet by mouth daily.    [provider]  metoprolol succinate (TOPROL-XL) 100 MG 24 hr tablet Take 100 mg by mouth daily. Take with or immediately following a meal.    [provider]  nitroGLYCERIN (NITROSTAT) 0.4 MG SL tablet Place 1 tablet (0.4 mg total) under the tongue every 5  (five) minutes as needed for chest pain. 12/17/16 03/17/17  Revankar, Reita Cliche, MD  pramipexole (MIRAPEX) 0.25 MG tablet Take 0.25 mg by mouth daily as needed. 12/16/16   [provider]  pravastatin (PRAVACHOL)  40 MG tablet Take 40 mg by mouth at bedtime.    [provider]  XARELTO 15 MG TABS tablet Take 1 tablet (15 mg total) by mouth daily with supper. Start this new dose once given the okay after the heart catheterization 03/12/17   Revankar, Reita Cliche, MD     Critical care time: 37 minutes    The patient is critically ill with multiple organ systems failure and requires high complexity decision making for assessment and support, frequent evaluation and titration of therapies, application of advanced monitoring technologies and extensive interpretation of multiple databases.    Critical Care Time devoted to patient care services described in this note is 37 minutes. This time reflects time of care of this [place] NP. This critical care time does not reflect procedure time but could involve care discussion time with the PCCM attending.  Redmond School., MSN, APRN, AGACNP-BC  Pulmonary & Critical Care  02/08/2022 , 1:06 AM  Please see [email or web address] for pager details  If no response, please call [phone] After hours, please call Elink at [phone]

## 2022-02-07 NOTE — ED Notes (Signed)
Silverio Lay, MD spoke to pt's sister on phone at this time.

## 2022-02-07 NOTE — Code Documentation (Signed)
Responded to Code Stroke called on pt already in the ED for AMS, LSN originally thought to be 2100. Code Stroke called for AMS. After speaking to family, LSN changed to 2 days ago. CBG-246. Prior to CT scan, pt was intubated for airway protection.  NIH-18 pre intubation. CT head negative. CTA/CTP-no LVO. VS/neuro checks q2h x 12, then q4h from neuro perspective. Plan ICU admission.

## 2022-02-07 NOTE — Consult Note (Signed)
NEUROLOGY CONSULTATION NOTE   Date of service: February 07, 2022 Patient Name: Justin Tanner MRN:  [id number] DOB:  06-Sep-1960 Reason for consult: "afibb with RVR and confusion agitation. Not making sense, LKW 2 days ago" Requesting Provider: No att. providers found _ _ _   _ __   _ __ _ _  __ __   _ __   __ _  History of Present Illness  Justin Tanner is a 61 y.o. male with PMH significant for afibb and taken off AC in 2019 due to chads score of 1, HTN, HLD, obseity, prior cardiac arrest in 2014 during lumbar spine surgery who walked over to his sister's house and confused and unable to answer questions or follow commands. Ha a seizure at the sister's home and EMS called. He appeared to have had another seizure enroute and on arrival to the ED, agitated and combative. Unable to speak or be redirected. The only words he was able to say was "what" to everything. He had to be held down by multiple people dueto being combative in the ED and ended up intubated. He was also in Afibb with RVR with rate of 150s-170s.  A code stroke was activated for concern for potential aphasia. LKW unclear but last seen by sister at baseline about 2 days ago. STAT CTH negative for any acute intracranial abnormality, CTA with no LVO, CTP with no mismatch.  Of note, he is on Depakote and indication is unclear. VPA levels were undetectable.  LKW: 02/04/22 mRS: 0 tNKASE: not offered, outside window Thrombectomy: not offered, no LVO NIHSS components Score: Comment  1a Level of Conscious 0[]  1[]  2[]  3[x]      1b LOC Questions 0[]  1[]  2[x]       1c LOC Commands 0[]  1[]  2[x]       2 Best Gaze 0[x]  1[]  2[]       3 Visual 0[x]  1[]  2[]  3[]      4 Facial Palsy 0[x]  1[]  2[]  3[]      5a Motor Arm - left 0[]  1[]  2[x]  3[]  4[]  UN[]    5b Motor Arm - Right 0[]  1[]  2[x]  3[]  4[]  UN[]    6a Motor Leg - Left 0[]  1[]  2[x]  3[]  4[]  UN[]    6b Motor Leg - Right 0[]  1[]  2[x]  3[]  4[]  UN[]    7 Limb Ataxia 0[x]  1[]  2[]  3[]  UN[]     8  Sensory 0[x]  1[]  2[]  UN[]      9 Best Language 0[]  1[]  2[]  3[x]      10 Dysarthria 0[]  1[x]  2[]  UN[]      11 Extinct. and Inattention 0[x]  1[]  2[]       TOTAL: 19     ROS   Unable to obtain 2/2 obtunded mentation.  Past History   Past Medical History:  Diagnosis Date   Arrhythmia    HLD (hyperlipidemia)    Hypertension    Obesity    Past Surgical History:  Procedure Laterality Date   LEFT HEART CATH AND CORONARY ANGIOGRAPHY N/A 12/19/2016   Procedure: LEFT HEART CATH AND CORONARY ANGIOGRAPHY;  Surgeon: Kathleene Hazel, MD;  Location: [facility] IN[facility]ASIVE CV LAB;  Service: Cardiovascular;  Laterality: N/A;   LEFT HEART CATHETERIZATION WITH CORONARY ANGIOGRAM N/A 04/07/2012   Procedure: LEFT HEART CATHETERIZATION WITH CORONARY ANGIOGRAM;  Surgeon: Peter M Swaziland, MD;  Location: [facility] CATH LAB;  Service: Cardiovascular;  Laterality: N/A;   LUMBAR LAMINECTOMY/DECOMPRESSION MICRODISCECTOMY  04/26/2012   Procedure: LUMBAR LAMINECTOMY/DECOMPRESSION MICRODISCECTOMY 1 LEVEL;  Surgeon: Reinaldo Meeker, MD;  Location: [facility]  NEURO ORS;  Service: Neurosurgery;  Laterality: Right;  Right lumbar four-five microdiskectomy   NECK SURGERY     Family History  Problem Relation Age of Onset   Hypertension Father    Heart disease Father    Heart disease Mother    Social History   Socioeconomic History   Marital status: Married    Spouse name: Not on file   Number of children: Not on file   Years of education: Not on file   Highest education level: Not on file  Occupational History   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     Drug use: No   Sexual activity: Not on file  Other Topics Concern   Not on file  Social History Narrative   Lives with wife, no tobacco, borderline DM   Social Determinants  of Health   Financial Resource Strain: Not on file  Food Insecurity: Not on file  Transportation Needs: Not on file  Physical Activity: Not on file  Stress: Not on file  Social Connections: Not on file   No Known Allergies  Medications  (Not in a hospital admission)    Vitals   Vitals:   02/07/22 2108 02/07/22 2110 02/07/22 2111  BP: 103/71 98/66   Pulse: (!) 115 (!) 127   Resp: (!) 23 (!) 26   SpO2: 92% 95%   Weight:   104 kg  Height:   6' (1.829 m)     Body mass index is 31.1 kg/m.  Physical Exam   General: restless in bed, not redirectable. HENT: Normal oropharynx and mucosa. Normal external appearance of ears and nose.  Neck: Supple, no pain or tenderness  CV: No JVD. No peripheral edema.  Pulmonary: Symmetric Chest rise. Normal respiratory effort.  Abdomen: Soft to touch, non-tender.  Ext: No cyanosis, edema, or deformity  Skin: No rash. Normal palpation of skin.  Feet covered in dirt vs fecal matter. Musculoskeletal: Normal digits and nails by inspection. No clubbing.   Neurologic Examination  Mental status/Cognition: agitated/combative, attempting to get up in the bed, eyes closed, opens eyes but does not make eye contact or follow commands. Speech/language: just says "what" to everything. Does not follow commands, does not answer questions. Mild dysarthria? Cranial nerves:   CN II Pupils equal and reactive to light, unable to assess for VF deficits   CN III,IV,VI EOM intact, no gaze preference or deviation, no nystagmus   CN V Cornaels intact BL   CN VII no asymmetry, no nasolabial fold flattening   CN VIII Does not turn head towards speech   CN IX & X Unable to assess.   CN XI Head mildine   CN XII midline tongue but does not protrude on command.   Motor:  Muscle bulk: normal, tone normal Moves all extremities spontaneously and antigravity. Unable to assess for VF deficits.  Sensation:  Light touch    Pin prick Withdraws in all extremities.    Temperature    Vibration   Proprioception    Coordination/Complex Motor:  - unable to assess.  Labs   CBC:  Recent Labs  Lab 02/07/22 2121  HGB 15.0  HCT 44.0    Basic Metabolic Panel:  Lab Results  Component Value  Date   NA 139 02/07/2022   K 3.1 (L) 02/07/2022   CO2 24 12/19/2016   GLUCOSE 246 (H) 02/07/2022   BUN 18 02/07/2022   CREATININE 1.40 (H) 02/07/2022   CALCIUM 8.9 12/19/2016   GFRNONAA >60 12/19/2016   GFRAA >60 12/19/2016   Lipid Panel: No results found for: "LDLCALC" HgbA1c:  Lab Results  Component Value Date   HGBA1C 6.0 (H) 03/24/2012   Urine Drug Screen:     Component Value Date/Time   LABOPIA NONE DETECTED 01/17/2012 1224   COCAINSCRNUR NONE DETECTED 01/17/2012 1224   LABBENZ NONE DETECTED 01/17/2012 1224   AMPHETMU NONE DETECTED 01/17/2012 1224   THCU POSITIVE (A) 01/17/2012 1224   LABBARB NONE DETECTED 01/17/2012 1224    Alcohol Level No results found for: "ETH"  CT Head without contrast(Personally reviewed): CTH was negative for a large hypodensity concerning for a large territory infarct or hyperdensity concerning for an ICH.  CT angio Head and Neck with contrast(Personally reviewed): No LVO  CT Perfusion: No mismatch  Delayted CTA: No dural venous thrombosis.  MRI Brain: pending  cEEG:  pending  Impression   RENDON HOWELL is a 61 y.o. male with PMH significant for afibb and taken off AC in 2019 due to chads score of 1, HTN, HLD, obseity, prior cardiac arrest in 2014 during lumbar spine surgery who presents with confusion, concern for aphasia and seizure x 2. He was in afibb with RVR with HR in 150s-170s, combative and not redirectable and ended up intubated. CTH, CTA, CTP and delalyed CTA with no ICH, no LVO, no mismatch, no dural venous thrombosis.  Etiology of his confusion is unclear, he seems to be on VPA per med review but levels here are undectable. Given his seizures, could have been post ictal vs subclinical  seizures.  Will also evaluate for other causes of encephalopathy and consider an LP if no clear cause of his presentation. He is not febrile here, no hypotension but is significantly tachycardic likely from afibb with RVR but also leukocytosis with WBC count of 19.  Recommendations  - cEEG - MRI Brain without contrast - loaded with Keppra 1500mg  IV once in the ED - empiric meningitis coverage with Vanc, ceftriaxone, ampicillin and acyclovir - UDS - synthetic opiod panel(sendout), cooxemetry panel, B12, folate, thiamine, ammonia, TSH. ______________________________________________________________________  This patient is critically ill and at significant risk of neurological worsening, death and care requires constant monitoring of vital signs, hemodynamics,respiratory and cardiac monitoring, neurological assessment, discussion with family, other specialists and medical decision making of high complexity. I spent 50 minutes of neurocritical care time  in the care of  this patient. This was time spent independent of any time provided by nurse practitioner or PA.  [facility] Pager Number Erick Blinks 02/07/2022  11:38 PM   Thank you for the opportunity to take part in the care of this patient. If you have any further questions, please contact the neurology consultation attending.  Signed,  02/09/2022 [facility] Pager Number Erick Blinks _ _ _   _ __   _ __ _ _  __ __   _ __   __ _

## 2022-02-07 NOTE — ED Notes (Signed)
Safety mitts applied to pt at this time.

## 2022-02-07 NOTE — ED Notes (Signed)
Pt arrival to room. Pt placed on monitor and vitals obtained. Pt placed on defib pads as precaution. Labs obtained at this time. MD at bedside. Pt currently unresponsive.

## 2022-02-08 ENCOUNTER — Inpatient Hospital Stay (HOSPITAL_COMMUNITY): Payer: Medicare Other

## 2022-02-08 ENCOUNTER — Emergency Department (HOSPITAL_COMMUNITY): Payer: Medicare Other

## 2022-02-08 DIAGNOSIS — A419 Sepsis, unspecified organism: Secondary | ICD-10-CM | POA: Diagnosis not present

## 2022-02-08 DIAGNOSIS — R569 Unspecified convulsions: Secondary | ICD-10-CM

## 2022-02-08 DIAGNOSIS — E785 Hyperlipidemia, unspecified: Secondary | ICD-10-CM | POA: Diagnosis present

## 2022-02-08 DIAGNOSIS — R109 Unspecified abdominal pain: Secondary | ICD-10-CM | POA: Diagnosis not present

## 2022-02-08 DIAGNOSIS — I110 Hypertensive heart disease with heart failure: Secondary | ICD-10-CM | POA: Diagnosis not present

## 2022-02-08 DIAGNOSIS — L89322 Pressure ulcer of left buttock, stage 2: Secondary | ICD-10-CM | POA: Diagnosis not present

## 2022-02-08 DIAGNOSIS — J9600 Acute respiratory failure, unspecified whether with hypoxia or hypercapnia: Secondary | ICD-10-CM

## 2022-02-08 DIAGNOSIS — N179 Acute kidney failure, unspecified: Secondary | ICD-10-CM | POA: Diagnosis not present

## 2022-02-08 DIAGNOSIS — F1910 Other psychoactive substance abuse, uncomplicated: Secondary | ICD-10-CM

## 2022-02-08 DIAGNOSIS — R4182 Altered mental status, unspecified: Secondary | ICD-10-CM

## 2022-02-08 DIAGNOSIS — I480 Paroxysmal atrial fibrillation: Secondary | ICD-10-CM | POA: Diagnosis present

## 2022-02-08 DIAGNOSIS — R600 Localized edema: Secondary | ICD-10-CM | POA: Diagnosis not present

## 2022-02-08 DIAGNOSIS — J9690 Respiratory failure, unspecified, unspecified whether with hypoxia or hypercapnia: Secondary | ICD-10-CM | POA: Diagnosis not present

## 2022-02-08 DIAGNOSIS — E876 Hypokalemia: Secondary | ICD-10-CM | POA: Diagnosis present

## 2022-02-08 DIAGNOSIS — I5042 Chronic combined systolic (congestive) and diastolic (congestive) heart failure: Secondary | ICD-10-CM | POA: Diagnosis present

## 2022-02-08 DIAGNOSIS — F32A Depression, unspecified: Secondary | ICD-10-CM | POA: Diagnosis not present

## 2022-02-08 DIAGNOSIS — R17 Unspecified jaundice: Secondary | ICD-10-CM | POA: Diagnosis not present

## 2022-02-08 DIAGNOSIS — E872 Acidosis, unspecified: Secondary | ICD-10-CM | POA: Diagnosis not present

## 2022-02-08 DIAGNOSIS — R4701 Aphasia: Secondary | ICD-10-CM | POA: Diagnosis present

## 2022-02-08 DIAGNOSIS — T43651A Poisoning by methamphetamines accidental (unintentional), initial encounter: Secondary | ICD-10-CM | POA: Diagnosis present

## 2022-02-08 DIAGNOSIS — Z6825 Body mass index (BMI) 25.0-25.9, adult: Secondary | ICD-10-CM | POA: Diagnosis not present

## 2022-02-08 DIAGNOSIS — J9601 Acute respiratory failure with hypoxia: Secondary | ICD-10-CM

## 2022-02-08 DIAGNOSIS — E440 Moderate protein-calorie malnutrition: Secondary | ICD-10-CM | POA: Diagnosis not present

## 2022-02-08 DIAGNOSIS — J189 Pneumonia, unspecified organism: Secondary | ICD-10-CM | POA: Diagnosis present

## 2022-02-08 DIAGNOSIS — G928 Other toxic encephalopathy: Secondary | ICD-10-CM | POA: Diagnosis not present

## 2022-02-08 DIAGNOSIS — I4891 Unspecified atrial fibrillation: Secondary | ICD-10-CM

## 2022-02-08 DIAGNOSIS — D649 Anemia, unspecified: Secondary | ICD-10-CM | POA: Diagnosis present

## 2022-02-08 DIAGNOSIS — I959 Hypotension, unspecified: Secondary | ICD-10-CM | POA: Diagnosis present

## 2022-02-08 DIAGNOSIS — E162 Hypoglycemia, unspecified: Secondary | ICD-10-CM | POA: Diagnosis not present

## 2022-02-08 DIAGNOSIS — Z87891 Personal history of nicotine dependence: Secondary | ICD-10-CM | POA: Diagnosis not present

## 2022-02-08 LAB — ECHOCARDIOGRAM COMPLETE
AR max vel: 2.28 cm2
AV Area VTI: 2.23 cm2
AV Area mean vel: 2.21 cm2
AV Mean grad: 2 mmHg
AV Peak grad: 3.6 mmHg
Ao pk vel: 0.96 m/s
Height: 72 in
S' Lateral: 4 cm
Weight: 3022.95 oz

## 2022-02-08 LAB — COOXEMETRY PANEL
Carboxyhemoglobin: 1.9 % — ABNORMAL HIGH (ref 0.5–1.5)
Methemoglobin: 0.8 % (ref 0.0–1.5)
O2 Saturation: 89.1 %
Total hemoglobin: 13 g/dL (ref 12.0–16.0)

## 2022-02-08 LAB — RESPIRATORY PANEL BY PCR

## 2022-02-08 LAB — GLUCOSE, CAPILLARY
Glucose-Capillary: 121 mg/dL — ABNORMAL HIGH (ref 70–99)
Glucose-Capillary: 101 mg/dL — ABNORMAL HIGH (ref 70–99)
Glucose-Capillary: 85 mg/dL (ref 70–99)
Glucose-Capillary: 136 mg/dL — ABNORMAL HIGH (ref 70–99)
Glucose-Capillary: 119 mg/dL — ABNORMAL HIGH (ref 70–99)
Glucose-Capillary: 80 mg/dL (ref 70–99)
Glucose-Capillary: 58 mg/dL — ABNORMAL LOW (ref 70–99)

## 2022-02-08 LAB — CBC
HCT: 37 % — ABNORMAL LOW (ref 39.0–52.0)
HCT: 36.6 % — ABNORMAL LOW (ref 39.0–52.0)
HCT: 33.8 % — ABNORMAL LOW (ref 39.0–52.0)
Hemoglobin: 12.6 g/dL — ABNORMAL LOW (ref 13.0–17.0)
Hemoglobin: 12.3 g/dL — ABNORMAL LOW (ref 13.0–17.0)
Hemoglobin: 11.4 g/dL — ABNORMAL LOW (ref 13.0–17.0)
MCH: 28.6 pg (ref 26.0–34.0)
MCH: 27.7 pg (ref 26.0–34.0)
MCH: 28.1 pg (ref 26.0–34.0)
MCHC: 34.1 g/dL (ref 30.0–36.0)
MCHC: 33.6 g/dL (ref 30.0–36.0)
MCHC: 33.7 g/dL (ref 30.0–36.0)
MCV: 83.9 fL (ref 80.0–100.0)
MCV: 82.4 fL (ref 80.0–100.0)
MCV: 83.3 fL (ref 80.0–100.0)
Platelets: 376 10*3/uL (ref 150–400)
Platelets: 364 10*3/uL (ref 150–400)
Platelets: 326 10*3/uL (ref 150–400)
RBC: 4.41 MIL/uL (ref 4.22–5.81)
RBC: 4.44 MIL/uL (ref 4.22–5.81)
RBC: 4.06 MIL/uL — ABNORMAL LOW (ref 4.22–5.81)
RDW: 13.8 % (ref 11.5–15.5)
RDW: 13.9 % (ref 11.5–15.5)
RDW: 13.8 % (ref 11.5–15.5)
WBC: 10.6 10*3/uL — ABNORMAL HIGH (ref 4.0–10.5)
WBC: 11.3 10*3/uL — ABNORMAL HIGH (ref 4.0–10.5)
WBC: 11.5 10*3/uL — ABNORMAL HIGH (ref 4.0–10.5)
nRBC: 0 % (ref 0.0–0.2)
nRBC: 0 % (ref 0.0–0.2)
nRBC: 0 % (ref 0.0–0.2)

## 2022-02-08 LAB — CREATININE, SERUM
Creatinine, Ser: 1.34 mg/dL — ABNORMAL HIGH (ref 0.61–1.24)
GFR, Estimated: >60 mL/min (ref >60)

## 2022-02-08 LAB — TSH: TSH: 0.902 u[IU]/mL (ref 0.350–4.500)

## 2022-02-08 LAB — BASIC METABOLIC PANEL
Anion gap: 14 (ref 5–15)
BUN: 16 mg/dL (ref 8–23)
CO2: 21 mmol/L — ABNORMAL LOW (ref 22–32)
Calcium: 9.3 mg/dL (ref 8.9–10.3)
Chloride: 105 mmol/L (ref 98–111)
Creatinine, Ser: 1.27 mg/dL — ABNORMAL HIGH (ref 0.61–1.24)
GFR, Estimated: >60 mL/min (ref >60)
Glucose, Bld: 77 mg/dL (ref 70–99)
Potassium: 3.1 mmol/L — ABNORMAL LOW (ref 3.5–5.1)
Sodium: 140 mmol/L (ref 135–145)

## 2022-02-08 LAB — LACTIC ACID, PLASMA
Lactic Acid, Venous: 1.9 mmol/L (ref 0.5–1.9)
Lactic Acid, Venous: 1.4 mmol/L (ref 0.5–1.9)

## 2022-02-08 LAB — AMMONIA: Ammonia: 11 umol/L (ref 9–35)

## 2022-02-08 LAB — PHOSPHORUS: Phosphorus: 2.3 mg/dL — ABNORMAL LOW (ref 2.5–4.6)

## 2022-02-08 LAB — ACETAMINOPHEN LEVEL: Acetaminophen (Tylenol), Serum: <10 ug/mL — ABNORMAL LOW (ref 10–30)

## 2022-02-08 LAB — FOLATE: Folate: 20.8 ng/mL (ref >5.9)

## 2022-02-08 LAB — TROPONIN I (HIGH SENSITIVITY): Troponin I (High Sensitivity): 12 ng/L (ref <18)

## 2022-02-08 LAB — VITAMIN B12: Vitamin B-12: 318 pg/mL (ref 180–914)

## 2022-02-08 LAB — MAGNESIUM: Magnesium: 2.3 mg/dL (ref 1.7–2.4)

## 2022-02-08 LAB — MRSA NEXT GEN BY PCR, NASAL: MRSA by PCR Next Gen: NOT DETECTED

## 2022-02-08 LAB — HIV ANTIBODY (ROUTINE TESTING W REFLEX): HIV Screen 4th Generation wRfx: NONREACTIVE

## 2022-02-08 LAB — TRIGLYCERIDES: Triglycerides: 82 mg/dL (ref <150)

## 2022-02-08 LAB — SALICYLATE LEVEL: Salicylate Lvl: <7 mg/dL — ABNORMAL LOW (ref 7.0–30.0)

## 2022-02-08 MED ORDER — VITAL HIGH PROTEIN PO LIQD: 1000.0000 mL | ORAL | Status: DC

## 2022-02-08 MED ORDER — ACETAMINOPHEN 325 MG PO TABS
650.0000 mg | ORAL_TABLET | Freq: Four times a day (QID) | ORAL | Status: DC | PRN
  Administered 2022-02-09 – 2022-02-10 (×3): 650 mg
  Filled 2022-02-08 (×3): qty 2

## 2022-02-08 MED ORDER — VANCOMYCIN HCL IN DEXTROSE 1-5 GM/200ML-% IV SOLN
1000.0000 mg | Freq: Two times a day (BID) | INTRAVENOUS | Status: DC
  Administered 2022-02-08: 1000 mg via INTRAVENOUS
  Filled 2022-02-08: qty 200

## 2022-02-08 MED ORDER — LACTATED RINGERS IV BOLUS
1000.0000 mL | Freq: Once | INTRAVENOUS | Status: AC
  Administered 2022-02-08: 1000 mL via INTRAVENOUS

## 2022-02-08 MED ORDER — THIAMINE HCL 100 MG/ML IJ SOLN
250.0000 mg | Freq: Every day | INTRAVENOUS | Status: DC
  Administered 2022-02-10 – 2022-02-12 (×3): 250 mg via INTRAVENOUS
  Filled 2022-02-08 (×4): qty 2.5

## 2022-02-08 MED ORDER — LACTATED RINGERS IV SOLN: INTRAVENOUS | Status: DC

## 2022-02-08 MED ORDER — VITAL 1.5 CAL PO LIQD
1000.0000 mL | ORAL | Status: DC
  Administered 2022-02-08 – 2022-02-10 (×2): 1000 mL
  Filled 2022-02-08: qty 1000

## 2022-02-08 MED ORDER — DEXTROSE 5 % IV SOLN
850.0000 mg | Freq: Three times a day (TID) | INTRAVENOUS | Status: DC
  Administered 2022-02-08: 850 mg via INTRAVENOUS
  Filled 2022-02-08 (×2): qty 17

## 2022-02-08 MED ORDER — DEXTROSE 50 % IV SOLN
INTRAVENOUS | Status: AC
  Filled 2022-02-08: qty 50

## 2022-02-08 MED ORDER — THIAMINE HCL 100 MG/ML IJ SOLN
500.0000 mg | Freq: Three times a day (TID) | INTRAVENOUS | Status: AC
  Administered 2022-02-08 – 2022-02-09 (×5): 500 mg via INTRAVENOUS
  Filled 2022-02-08 (×5): qty 5

## 2022-02-08 MED ORDER — PERFLUTREN LIPID MICROSPHERE
1.0000 mL | INTRAVENOUS | Status: AC | PRN
  Administered 2022-02-08: 3 mL via INTRAVENOUS

## 2022-02-08 MED ORDER — HEPARIN SODIUM (PORCINE) 5000 UNIT/ML IJ SOLN
5000.0000 [IU] | Freq: Three times a day (TID) | INTRAMUSCULAR | Status: DC
  Administered 2022-02-08 – 2022-02-12 (×14): 5000 [IU] via SUBCUTANEOUS
  Filled 2022-02-08 (×14): qty 1

## 2022-02-08 MED ORDER — ASPIRIN 81 MG PO CHEW
81.0000 mg | CHEWABLE_TABLET | Freq: Every day | ORAL | Status: DC
  Administered 2022-02-08 – 2022-02-10 (×3): 81 mg
  Filled 2022-02-08 (×3): qty 1

## 2022-02-08 MED ORDER — SODIUM CHLORIDE 0.9 % IV SOLN
2.0000 g | Freq: Two times a day (BID) | INTRAVENOUS | Status: DC
  Administered 2022-02-08: 2 g via INTRAVENOUS
  Filled 2022-02-08: qty 20

## 2022-02-08 MED ORDER — FAMOTIDINE 20 MG PO TABS
20.0000 mg | ORAL_TABLET | Freq: Two times a day (BID) | ORAL | Status: DC
  Administered 2022-02-08 – 2022-02-10 (×5): 20 mg
  Filled 2022-02-08 (×5): qty 1

## 2022-02-08 MED ORDER — SODIUM CHLORIDE 0.9 % IV SOLN
2.0000 g | INTRAVENOUS | Status: DC
  Filled 2022-02-08: qty 2000

## 2022-02-08 MED ORDER — INSULIN ASPART 100 UNIT/ML IJ SOLN
0.0000 [IU] | INTRAMUSCULAR | Status: DC
  Administered 2022-02-08: 2 [IU] via SUBCUTANEOUS
  Administered 2022-02-10: 3 [IU] via SUBCUTANEOUS
  Administered 2022-02-10 (×2): 2 [IU] via SUBCUTANEOUS

## 2022-02-08 MED ORDER — PANTOPRAZOLE SODIUM 40 MG PO TBEC: 40.0000 mg | DELAYED_RELEASE_TABLET | Freq: Every day | ORAL | Status: DC

## 2022-02-08 MED ORDER — POTASSIUM CHLORIDE 20 MEQ PO PACK
40.0000 meq | PACK | Freq: Once | ORAL | Status: AC
  Administered 2022-02-08: 40 meq
  Filled 2022-02-08: qty 2

## 2022-02-08 MED ORDER — POTASSIUM PHOSPHATES 15 MMOLE/5ML IV SOLN
20.0000 mmol | Freq: Once | INTRAVENOUS | Status: AC
  Administered 2022-02-08: 20 mmol via INTRAVENOUS
  Filled 2022-02-08: qty 6.67

## 2022-02-08 MED ORDER — DOCUSATE SODIUM 50 MG/5ML PO LIQD: 100.0000 mg | Freq: Two times a day (BID) | ORAL | Status: DC | PRN

## 2022-02-08 MED ORDER — ORAL CARE MOUTH RINSE: 15.0000 mL | OROMUCOSAL | Status: DC | PRN

## 2022-02-08 MED ORDER — MIDAZOLAM HCL 2 MG/2ML IJ SOLN: 1.0000 mg | INTRAMUSCULAR | Status: DC | PRN

## 2022-02-08 MED ORDER — ORAL CARE MOUTH RINSE
15.0000 mL | OROMUCOSAL | Status: DC
  Administered 2022-02-08 – 2022-02-10 (×33): 15 mL via OROMUCOSAL

## 2022-02-08 MED ORDER — DEXTROSE 50 % IV SOLN
12.5000 g | INTRAVENOUS | Status: AC
  Administered 2022-02-08: 12.5 g via INTRAVENOUS

## 2022-02-08 MED ORDER — FENTANYL BOLUS VIA INFUSION
50.0000 ug | INTRAVENOUS | Status: DC | PRN
  Administered 2022-02-08: 50 ug via INTRAVENOUS

## 2022-02-08 MED ORDER — THIAMINE HCL 100 MG/ML IJ SOLN: 100.0000 mg | Freq: Every day | INTRAMUSCULAR | Status: DC

## 2022-02-08 MED ORDER — FENTANYL CITRATE (PF) 100 MCG/2ML IJ SOLN
100.0000 ug | Freq: Once | INTRAMUSCULAR | Status: AC
  Administered 2022-02-07: 100 ug via INTRAVENOUS

## 2022-02-08 MED ORDER — POLYETHYLENE GLYCOL 3350 17 G PO PACK: 17.0000 g | PACK | Freq: Every day | ORAL | Status: DC | PRN

## 2022-02-08 MED ORDER — CHLORHEXIDINE GLUCONATE CLOTH 2 % EX PADS
6.0000 | MEDICATED_PAD | Freq: Every day | CUTANEOUS | Status: DC
  Administered 2022-02-08 – 2022-02-12 (×6): 6 via TOPICAL

## 2022-02-08 NOTE — Procedures (Addendum)
Patient Name: Justin Tanner  MRN: [id number]  Epilepsy Attending: Charlsie Quest  Referring Physician/Provider: Erick Blinks, MD  Duration: 02/07/2022 2303 to 02/08/2022 2303  Patient history: 61 y.o. male with PMH significant for afibb and taken off AC in 2019 due to chads score of 1, HTN, HLD, obseity, prior cardiac arrest in 2014 during lumbar spine surgery who presents with confusion, concern for aphasia and seizure x 2.  EEG to evaluate for seizure.  Level of alertness: comatose  AEDs during EEG study: Propofol  Technical aspects: This EEG study was done with scalp electrodes positioned according to the 10-20 International system of electrode placement. Electrical activity was reviewed with band pass filter of 1-70Hz , sensitivity of 7 uV/mm, display speed of 24mm/sec with a 60Hz  notched filter applied as appropriate. EEG data were recorded continuously and digitally stored.  Video monitoring was available and reviewed as appropriate.  Description: EEG showed continuous generalized 3 to 7 Hz theta-delta slowing admixed with an excessive amount of 15 to 18 Hz beta activity distributed symmetrically and diffusely. As sedation was weaned, EEG showed posterior dominant rhythm of 9-10 Hz activity of moderate voltage (25-35 uV) seen predominantly in posterior head regions, symmetric and reactive to eye opening and eye closing. Sleep was characterized by vertex waves, sleep spindles (12 to 14 Hz), maximal frontocentral region.  Hyperventilation and photic stimulation were not performed.     ABNORMALITY - Continuous slow, generalized - Excessive beta, generalized  IMPRESSION: This study was initially suggestive of severe diffuse encephalopathy, nonspecific etiology but likely related to sedation. As sedation was weaned, EEG was within normal limits. No seizures or epileptiform discharges were seen throughout the recording.  Justin Tanner 

## 2022-02-08 NOTE — Progress Notes (Addendum)
Subjective: Awakens easily, follows commands, no further seizures  Exam: Vitals:   02/08/22 1400 02/08/22 1435  BP: 104/74 106/65  Pulse: 75 77  Resp: 18 18  Temp:    SpO2: 100% 100%   Gen: In bed, intubated No meningismus  Neuro: MS: Opens eyes to mild stimulation, follows commands CN: Pupils equal round and reactive, extraocular movements intact Motor: Moves all extremities spontaneously and to command Sensory: Response to mild noxious stimulation in all four extremities  Pertinent Labs: UDS positive for methamphetamine  Impression: 61 year old male who presented with altered mental status and seizures.  He has a strong history of methamphetamine use including going on "binges."  I suspect that drug intoxication is the likely etiology of his altered mental status/seizures.  He has been afebrile, and his mild leukocytosis is nonspecific.  He has no meningismus.  I suspect that CNS infection is very unlikely, and would favor scaling back on antibiotics and observing.  If he does spike fevers, have further seizures, or worsen, then LP would likely be necessary, but I feel like it would be low yield at this time given that we have another explanation.  Given his atrial fibrillation, I would continue to favor getting an MRI when possible.  Recommendations: 1) can discontinue antibiotics 2) given provoked seizure, will hold off on scheduled antiepileptics 3) I will continue EEG for one more night 4) MRI brain 5) neurology will continue to follow  This patient is critically ill and at significant risk of neurological worsening, death and care requires constant monitoring of vital signs, hemodynamics,respiratory and cardiac monitoring, neurological assessment, discussion with family, other specialists and medical decision making of high complexity. I spent 38 minutes of neurocritical care time  in the care of  this patient. This was time spent independent of any time provided by nurse  practitioner or PA.  Ritta Slot, MD [facility] [phone]  If 7pm- 7am, please page neurology on call as listed in AMION. 02/08/2022  3:07 PM

## 2022-02-08 NOTE — H&P (Signed)
NAME:  Justin Tanner, MRN:  [id number], DOB:  11/30/1960, LOS: 0 ADMISSION DATE:  02/07/2022, CONSULTATION DATE:  02/07/2022 REFERRING MD:  Silverio LayYao , CHIEF COMPLAINT: Altered mental status  History of Present Illness:  Patient is encephalopathic and/or intubated. Therefore history has been obtained from chart review and   Justin Tanner, is a 61 y.o. male, who [facility] with a chief complaint of altered mental status  They have a pertinent past medical history of polysubstance abuse, CAD, HLD, HTN, Afib  Per sister patient has been increasingly reclusive at home.  Lost 60 to 70 pounds over the past few months.  Reportedly "slept the whole month of October."  Reportedly been using meth with increasing frequency.  On 11/24 patient walks to sisters house and presented with aphasia, which progressed to the seizure-like activity.  EMS was called.  ED course was notable for activation of a code stroke.  CT head negative for LVO or acute changes. EEG was started.  UDS positive for amphetamines, THC.  Patient was found to be in A-fib.  Cardioversion was attempted with conversion to sinus rhythm but conversion back to A-fib.  He was intubated post cardioversion. Keppra load given. Afebrile. No documented hypotension. WBC 19.1. Empiric meningitis coverage started by neuro.  PCCM was consulted for admission.   Pertinent  Medical History  CAD, HLD, HTN, Afib  Significant Hospital Events: Including procedures, antibiotic start and stop dates in addition to other pertinent events   11/24 Admit. PCCM consult. EEG>, CTH neg>, Intubated>, Vanc/Ceftriaxone> Dilt 5mg , prop, 50, fent 50, versed 3 > Got keppr ain ED 11/24:   Interim History / Subjective:    11/25 -currently remains on ventilator FiO2 30%.  He is on propofol infusion Fentanyl infusion. Jsut off Versed infusion. Now in Sinus:  and Cardizem drip off.  On thiamine. -   ?  Has low-grade fever.   White count 11.3 K.  Culture  negative so far.  On antibiotics ceftriaxone and vancomycin.   On c-EEG. No anti-epileptics currently  UDS+ for THC, amphetatmines, benzos  Followed commands for RN  Objective   Blood pressure 98/67, pulse 78, temperature 99.9 F (37.7 C), resp. rate 18, height 6' (1.829 m), weight 85.7 kg, SpO2 100 %.    Vent Mode: PRVC FiO2 (%):  [30 %-100 %] 30 % Set Rate:  [18 bmp] 18 bmp Vt Set:  [620 mL] 620 mL PEEP:  [5 cmH20] 5 cmH20 Plateau Pressure:  [12 cmH20-15 cmH20] 15 cmH20   Intake/Output Summary (Last 24 hours) at 02/08/2022 [id number] Last data filed at 02/08/2022 0800 Gross per 24 hour  Intake 1176.56 ml  Output 700 ml  Net 476.56 ml   Filed Weights   02/07/22 2111 02/08/22 0229  Weight: 104 kg 85.7 kg    Examination: General Appearance:  Looks criticall ill Head:  Normocephalic, without obvious abnormality, atraumatic Eyes:  PERRL - yes, conjunctiva/corneas - muddy     Ears:  Normal external ear canals, both ears Nose:  G tube - . x Throat:  ETT TUBE - yes , OG tube - yes Neck:  Supple,  No enlargement/tenderness/nodules Lungs: Clear to auscultation bilaterally, Ventilator   Synchrony - yes.  Heart:  S1 and S2 normal, no murmur, CVP - no.  Pressors - no Abdomen:  Soft, no masses, no organomegaly Genitalia / Rectal:  Not done Extremities:  Extremities- intact Skin:  ntact in exposed areas . Sacral area - note examined Neurologic:  Sedation - fent gtt, diprivan gtt, versed just off -> RASS - -3. Followed commands for RN. C-eeng ongoing     Resolved Hospital Problem list     Assessment & Plan:  Seizure, suspect secondary to methamphetamine toxicity Acute metabolic encephalopathy secondary to above Polysubstance abuse UDS prior degenerative for THC, amphetamines.  Patient with severe agitation not controlled by multiple medications. Afebrile. No documented hypotension. WBC 19.1.  Family history supporting increased drug use.  Head CT negative for LVO or acute  changes.  11/25 -has continuous EEG ongoing.  Loaded with Keppra in the ER but no antiepileptic since then.  Did follow commands during wake up assessment  Plan - Await neuroinput on continuous EEG results = Await neuro opinion on antiepileptics scheduled. - Versed as needed - Stop Versed infusion - Wean fentanyl infusion to off - Maintain with propofol infusion while on the ventilator to the extent possible -Fentanyl as needed - Versed as needed -Continue empiric Meningitis coverage [see infectious diseases section below] -Await synthetic opioid panel - Await coox panel, B12, folate, thiamine and ammonia and TSH   Empiric meningitis concern for meningitis  Level/25/23: Low-grade fever versus afebrile  Plan - Continue vancomycin - Continue ceftriaxone - Add ampicillin - Add acyclovir with hydration  Chronic systolic heart failure ejection fraction 45% in 2014  02/08/2022: Looks dry.  Troponin normal.  Plan - Repeat echocardiogram  Hypertension/hyperlipidemia/coronary artery disease not otherwise specified Atrial fibrillation upon admission -status post cardioversion in the ER - Per chart review looks like patient had AC stopped in 2019 due to CHA2DS2-VASc of 1.  Suspect A-fib worsened due to methamphetamine use.  02/08/22: In sinus rhythm and Cardizem off   Plan - DC [facility]em from [facility] - Telemetry -Hold home antihypertensives Continue baby aspirin  Acute respiratory failure secondary to acute metabolic encephalopathy - GCS of 8 in the ED.  Intubated post cardioversion for cardioversion  02/08/2022 - > does yes meet criteria for SBT/Extubation in setting of Acute Respiratory Failure due to acute encephalopathy  Plan - Wean sedation and complete workup assessment - If doing well can extubate today or tomorrow   AKI - Creat 1.92 (baseline 0.9-1).  Suspect prerenal  02/08/2022: Creatinine improving  Plan -Ensure renal perfusion. Goal MAP 65 or greater. -Avoid  neprotoxic drugs as possible. -Strict I&O's -continue foley  Hypophostaemia - mild Hypokalemia mild  Plan  - replete 02/08/22  Hyperglycemia Glucose 249 -Blood Glucose goal 140-180. -SSI  Transaminitis Bilirubinemia -Supportive care as above -Trend  Anemia - onset 02/07/22  11/25 - no active bleed  Plan   - PRBC for hgb </= 6.9gm%    - exceptions are   -  if ACS susepcted/confirmed then transfuse for hgb </= 8.0gm%,  or    -  active bleeding with hemodynamic instability, then transfuse regardless of hemoglobin value   At at all times try to transfuse 1 unit prbc as possible with exception of active hemorrhage   Suspect malnutrition, unspecified Failure to thrive Unintentional weight loss Depression Substance abuse Wife died in 11-Nov-2022. 60 to 70 pounds weight loss over the past few months.  Increased substance abuse -RD consult -Monitor for refeedign  Best Practice (right click and "Reselect all SmartList Selections" daily)   Diet/type: NPO -start tube feeds DVT prophylaxis: prophylactic heparin  GI prophylaxis: N/A Lines: N/A Foley:  Yes, and it is still needed Code Status:  full code Last date of multidisciplinary goals of care discussion  - [discussed with sister on 11/24.  Unsure if patient wishes at this time continue full scope of care.] -Call Venora Maples [phone] - updated. Described him as a stubborn strong willed person       ATTESTATION & SIGNATURE   The patient PAZ WINSETT is critically ill with multiple organ systems failure and requires high complexity decision making for assessment and support, frequent evaluation and titration of therapies, application of advanced monitoring technologies and extensive interpretation of multiple databases.   Critical Care Time devoted to patient care services described in this note is  35  Minutes. This time reflects time of care of this signee Dr Kalman Shan. This critical care time does  not reflect procedure time, or teaching time or supervisory time of PA/NP/Med student/Med Resident etc but could involve care discussion time     Dr. Kalman Shan, M.D., [facility].C.P Pulmonary and Critical Care Medicine Medical Director - [employer] ICU Staff Physician, [facility] Pager: [phone], If no answer or between  15:00h - 7:00h: call [phone]  02/08/2022 9:03 AM    LABS    PULMONARY Recent Labs  Lab 02/07/22 2121 02/07/22 2226 02/08/22 0005  PHART  --  7.445  --   PCO2ART  --  35.4  --   PO2ART  --  546*  --   HCO3  --  24.5  --   TCO2 13* 26  --   O2SAT  --  100 89.1    CBC Recent Labs  Lab 02/07/22 2111 02/07/22 2121 02/07/22 2226 02/08/22 0005 02/08/22 0415  HGB 13.9   < > 12.9* 12.6* 12.3*  HCT 45.4   < > 38.0* 37.0* 36.6*  WBC 19.1*  --   --  10.6* 11.3*  PLT 435*  --   --  376 364   < > = values in this interval not displayed.    COAGULATION Recent Labs  Lab 02/07/22 2111  INR 1.2    CARDIAC  No results for input(s): "TROPONINI" in the last 168 hours. No results for input(s): "PROBNP" in the last 168 hours.   CHEMISTRY Recent Labs  Lab 02/07/22 2111 02/07/22 2121 02/07/22 2226 02/08/22 0005 02/08/22 0415  NA 140 139 137  --  140  K 3.5 3.1* 3.1*  --  3.1*  CL 104 108  --   --  105  CO2 11*  --   --   --  21*  GLUCOSE 249* 246*  --   --  77  BUN 17 18  --   --  16  CREATININE 1.72* 1.40*  --  1.34* 1.27*  CALCIUM 9.4  --   --   --  9.3  MG  --   --   --   --  2.3  PHOS  --   --   --   --  2.3*   Estimated Creatinine Clearance: 67 mL/min (A) (by C-G formula based on SCr of 1.27 mg/dL (H)).   LIVER Recent Labs  Lab 02/07/22 2111  AST 55*  ALT 46*  ALKPHOS 89  BILITOT 2.3*  PROT 7.1  ALBUMIN 3.8  INR 1.2     INFECTIOUS Recent Labs  Lab 02/08/22 0005 02/08/22 0415  LATICACIDVEN 1.9 1.4     ENDOCRINE CBG (last 3)  Recent Labs     02/08/22 0156 02/08/22 0312 02/08/22 0735  GLUCAP 121* 101* 85  IMAGING x48h  - image(s) personally visualized  -   highlighted in bold DG Chest Port 1 View  Result Date: 02/08/2022 CLINICAL DATA:  Respiratory failure. EXAM: PORTABLE CHEST 1 VIEW COMPARISON:  February 07, 2022. FINDINGS: The heart size and mediastinal contours are within normal limits. Endotracheal and nasogastric tubes are unchanged in position. Both lungs are clear. The visualized skeletal structures are unremarkable. IMPRESSION: No active disease. Electronically Signed   By: Lupita Raider M.D.   On: 02/08/2022 08:11   CT ANGIO HEAD NECK W WO CM W PERF (CODE STROKE)  Result Date: 02/07/2022 CLINICAL DATA:  Stroke suspected, altered mental status EXAM: CT ANGIOGRAPHY HEAD AND NECK CT PERFUSION BRAIN TECHNIQUE: Multidetector CT imaging of the head and neck was performed using the standard protocol during bolus administration of intravenous contrast. Multiplanar CT image reconstructions and MIPs were obtained to evaluate the vascular anatomy. Carotid stenosis measurements (when applicable) are obtained utilizing NASCET criteria, using the distal internal carotid diameter as the denominator. Multiphase CT imaging of the brain was performed following IV bolus contrast injection. Subsequent parametric perfusion maps were calculated using RAPID software. RADIATION DOSE REDUCTION: This exam was performed according to the departmental dose-optimization program which includes automated exposure control, adjustment of the mA and/or kV according to patient size and/or use of iterative reconstruction technique. CONTRAST:  OMNIPAQUE IOHEXOL 350 MG/ML SOLN COMPARISON:  No prior CTA, correlation is made with 02/07/2022 CT head FINDINGS: CT HEAD FINDINGS For noncontrast findings, please see same day CT head. CTA NECK FINDINGS Aortic arch: Standard branching. Imaged portion shows no evidence of aneurysm or dissection. No  significant stenosis of the major arch vessel origins. Aortic atherosclerosis. Right carotid system: No evidence of stenosis, dissection, or occlusion. Left carotid system: No evidence of stenosis, dissection, or occlusion. Vertebral arteries: No evidence of stenosis, dissection, or occlusion. Skeleton: No acute osseous abnormality. Edentulous. Degenerative changes in the cervical spine. Status post ACDF C6-C7. Other neck: Endotracheal and orogastric tubes noted. No additional acute finding in the neck. Upper chest: No focal pulmonary opacity or pleural effusion. Mild centrilobular emphysema. Review of the MIP images confirms the above findings CTA HEAD FINDINGS Anterior circulation: Both internal carotid arteries are patent to the termini, without significant stenosis. A1 segments patent. Normal anterior communicating artery. Anterior cerebral arteries are patent to their distal aspects. No M1 stenosis or occlusion. MCA branches perfused, with somewhat diminished enhancement in the anterior right MCA branches compared to the left (series 8, image 79), although no focal stenosis is seen. Posterior circulation: Vertebral arteries patent to the vertebrobasilar junction without stenosis. Posterior inferior cerebellar arteries patent proximally. Basilar patent to its distal aspect. Superior cerebellar arteries patent proximally. Patent P1 segments. PCAs perfused to their distal aspects without stenosis. The right posterior communicating artery is not visualized. Venous sinuses: As permitted by contrast timing, patent. Anatomic variants: None significant. Review of the MIP images confirms the above findings CT Brain Perfusion Findings: ASPECTS: 10 CBF (<30%) Volume: 76mL Perfusion (Tmax>6.0s) volume: 23mL Mismatch Volume: 17mL Infarction Location:None IMPRESSION: 1. No intracranial large vessel occlusion or significant stenosis. Somewhat diminished enhancement in the anterior right MCA branches compared to the left,  although no focal stenosis is seen. 2. No hemodynamically significant stenosis in the neck. 3. No evidence of core infarct or penumbra on CT perfusion. 4. Aortic atherosclerosis. 5. Emphysema. Aortic Atherosclerosis (ICD10-I70.0) and Emphysema (ICD10-J43.9). Electronically Signed   By: Wiliam Ke M.D.   On: 02/07/2022 22:29   DG Chest  Port 1 View  Result Date: 02/07/2022 CLINICAL DATA:  Status post intubation. EXAM: PORTABLE CHEST 1 VIEW COMPARISON:  None Available. FINDINGS: The heart size and mediastinal contours are within normal limits. Both lungs are clear. No acute osseous abnormality. Endotracheal tube approximately 5.5 cm above the carina. Feeding tube coursing below the diaphragm with distal tip projecting over the gastric fundus. IMPRESSION: 1. Endotracheal tube approximately 5.5 cm above the carina. 2. Feeding tube coursing below the diaphragm with distal tip projecting over the gastric fundus. Electronically Signed   By: Larose Hires D.O.   On: 02/07/2022 22:10   CT HEAD CODE STROKE WO CONTRAST  Result Date: 02/07/2022 CLINICAL DATA:  Code stroke.  Unresponsive EXAM: CT HEAD WITHOUT CONTRAST TECHNIQUE: Contiguous axial images were obtained from the base of the skull through the vertex without intravenous contrast. RADIATION DOSE REDUCTION: This exam was performed according to the departmental dose-optimization program which includes automated exposure control, adjustment of the mA and/or kV according to patient size and/or use of iterative reconstruction technique. COMPARISON:  07/02/2013 CT head with contrast FINDINGS: Brain: No evidence of acute infarction, hemorrhage, cerebral edema, mass, mass effect, or midline shift. No hydrocephalus or extra-axial collection. Vascular: No hyperdense vessel. Skull: Negative for fracture or focal lesion. Sinuses/Orbits: Mucosal thickening in the ethmoid air cells and sphenoid sinuses. Mucous retention cysts in the maxillary sinuses. The orbits are  unremarkable. Other: The mastoid air cells are well aerated. ASPECTS [facility] Stroke Program Early CT Score) - Ganglionic level infarction (caudate, lentiform nuclei, internal capsule, insula, M1-M3 cortex): 7 - Supraganglionic infarction (M4-M6 cortex): 3 Total score (0-10 with 10 being normal): 10 IMPRESSION: 1. No acute intracranial process. 2. ASPECTS is 10. Code stroke imaging results were communicated on 02/07/2022 at 10:07 pm to provider Dr. Derry Lory via secure text paging. Electronically Signed   By: Wiliam Ke M.D.   On: 02/07/2022 22:08

## 2022-02-08 NOTE — Progress Notes (Signed)
Initial Nutrition Assessment  DOCUMENTATION CODES:   Not applicable, suspect malnutrition but unable to confirm at this time  INTERVENTION:   Initiate trickle tube feeds via OG tube: - Vital 1.5 @ 20 ml/hr (480 ml/day)  Monitor magnesium, potassium, and phosphorus BID for at least 3 days, MD to replete as needed, as pt is at risk for refeeding syndrome given reports of minimal PO intake and significant recent weight loss.   RD will monitor for ability to advance tube feeding regimen to goal: - Vital 1.5 @ 55 ml/hr (1320 ml/day) - PROSource TF20 60 ml BID  Tube feeding regimen at goal rate would provide 2140 kcal, 129 grams of protein, and 1008 ml of H2O.   NUTRITION DIAGNOSIS:   Inadequate oral intake related to inability to eat as evidenced by NPO status.  GOAL:   Patient will meet greater than or equal to 90% of their needs  MONITOR:   Vent status, Labs, Weight trends, TF tolerance, Skin  REASON FOR ASSESSMENT:   Ventilator, Consult Assessment of nutrition requirement/status  ASSESSMENT:   61 year old male who presented to the ED on 11/24 with AMS and seizures. PMH of CAD, HTN, HLD, prior cardiac arrest in 2014 during lumbar spine surgery, atrial fibrillation, polysubstance abuse. Pt intubated in the ED. Pt admitted with acute toxic/metabolic encephalopathy from methamphetamine overdose, acidosis, and AKI.  RD working remotely.  Per H&P, pt has lost about 60-70 lbs over the last few months. Pt with history of polysubstance abuse and family reports that he does meth binges. Pt's family also reports that pt has been depressed since his wife passed away in 10/18/2022.  Reviewed weight history in chart. Last available weight is 104.7 kg from 03/20/17. Current weight is 85.7 kg. Suspect malnutrition is present but unable to confirm without NFPE.  Noted concern for meningitis. Consult received for trickle tube feeding initiation. Pt with OG tube in gastric fundus per x-ray,  currently to low intermittent suction.  Patient is currently intubated on ventilator support MV: 11.2 L/min Temp (24hrs), Avg:97.8 F (36.6 C), Min:96.1 F (35.6 C), Max:99.9 F (37.7 C)  Drips: Propofol: 30 mcg/kg/min (provides 494 kcal daily from lipid) Fentanyl LR: 75 ml/hr  Medications reviewed and include: colace, pepcid, SSI q 4 hours, miralax, klor-con 40 mEq x 1, IV thiamine 500 mg q 8 hours (taper), IV abx, IV potassium phosphate 20 mmol once  Labs reviewed: potassium 3.1, creatinine 1.27, phosphorus 2.3, elevated LFTs, WBC 11.3 CBG's: 85-121 x 24 hours  UOP: 550 ml x 12 hours I/O's: +504 ml since admit  NUTRITION - FOCUSED PHYSICAL EXAM:  Unable to complete at this time. RD working remotely.  Diet Order:   Diet Order             Diet NPO time specified  Diet effective now                   EDUCATION NEEDS:   Not appropriate for education at this time  Skin:  Skin Assessment: Skin Integrity Issues: Stage II: L buttocks  Last BM:  no documented BM  Height:   Ht Readings from Last 1 Encounters:  02/07/22 6' (1.829 m)    Weight:   Wt Readings from Last 1 Encounters:  02/08/22 85.7 kg   BMI:  Body mass index is 25.62 kg/m.  Estimated Nutritional Needs:   Kcal:  2000-2200  Protein:  110-130 grams  Fluid:  >2.0 L    Mertie Clause, MS, RD, LDN Inpatient  Clinical Dietitian Please see AMiON for contact information.

## 2022-02-08 NOTE — Progress Notes (Signed)
Pharmacy Antibiotic Note  Justin Tanner is a 61 y.o. male admitted on 02/07/2022 with  altered mental status/seizures .  Pharmacy has been consulted for Vancomycin dosing for empiric meningitis coverage. WBC mildly elevated. Mild bump in Scr.   Plan: Vancomycin 1000 mg IV q12h >>>Estimated AUC: 538 Ceftriaxone per MD Trend WBC, temp, renal function  F/U infectious work-up Drug levels as indicated   Height: 6' (182.9 cm) Weight: 85.7 kg (188 lb 15 oz) IBW/kg (Calculated) : 77.6  Temp (24hrs), Avg:97.2 F (36.2 C), Min:96.1 F (35.6 C), Max:99.5 F (37.5 C)  Recent Labs  Lab 02/07/22 2111 02/07/22 2121 02/08/22 0005 02/08/22 0415  WBC 19.1*  --  10.6* 11.3*  CREATININE 1.72* 1.40* 1.34* 1.27*  LATICACIDVEN  --   --  1.9 1.4    Estimated Creatinine Clearance: 67 mL/min (A) (by C-G formula based on SCr of 1.27 mg/dL (H)).    No Known Allergies  Abran Duke, PharmD, BCPS Clinical Pharmacist Phone: [phone]

## 2022-02-08 NOTE — Progress Notes (Signed)
Pharmacy Antibiotic Note  Justin Tanner is a 61 y.o. male admitted on 02/07/2022 with AMS.  Pharmacy has been consulted for acyclovir dosing for meningitis. Patient already on vancomycin and ceftriaxone.   Plan: Start acyclovir 850mg  IV q8h (based on TBW) and LR at 125 ml/hr  Continue vancomycin (nomogram dosing) and ceftriaxone as already ordered Adding ampicillin 2g q4h Monitor renal function, cultures, and clinical progression  Obtain vancomycin levels as appropriate   Height: 6' (182.9 cm) Weight: 85.7 kg (188 lb 15 oz) IBW/kg (Calculated) : 77.6  Temp (24hrs), Avg:97.8 F (36.6 C), Min:96.1 F (35.6 C), Max:99.9 F (37.7 C)  Recent Labs  Lab 02/07/22 2111 02/07/22 2121 02/08/22 0005 02/08/22 0415  WBC 19.1*  --  10.6* 11.3*  CREATININE 1.72* 1.40* 1.34* 1.27*  LATICACIDVEN  --   --  1.9 1.4    Estimated Creatinine Clearance: 67 mL/min (A) (by C-G formula based on SCr of 1.27 mg/dL (H)).    No Known Allergies  Antimicrobials this admission: Vancomycin 11/25 >>  Ceftriaxone 11/25 >> Ampicillin 11/25 >>  Dose adjustments this admission:  Microbiology results: 11/25 bcx: ngtd 11/25 TA:  Thank you for allowing pharmacy to be a part of this patient's care.  12/25, PharmD, BCPS Clinical Pharmacist 02/08/2022 9:27 AM

## 2022-02-08 NOTE — Progress Notes (Signed)
PM rounds - bedside  On diprivan gtt alone Furtehrw wean - gets agitiative On PRVC MRI still not done ID has dc'ed meningitis Rx BP better after fluids boluis  Cotninue to monnitopr     SIGNATURE    Dr. Brand Males, M.D., F.C.C.P,  Pulmonary and Critical Care Medicine Staff Physician, [facility] Director - Interstitial Lung Disease  Program  Medical Director - [facility] [place], [place], [address]   Pager: [phone], If no answer  -> Check AMION or Try [phone] Telephone (clinical office): [phone] Telephone (research): [phone]  6:05 PM 02/08/2022   LABS    PULMONARY Recent Labs  Lab 02/07/22 2121 02/07/22 2226 02/08/22 0005  PHART  --  7.445  --   PCO2ART  --  35.4  --   PO2ART  --  546*  --   HCO3  --  24.5  --   TCO2 13* 26  --   O2SAT  --  100 89.1    CBC Recent Labs  Lab 02/08/22 0005 02/08/22 0415 02/08/22 1147  HGB 12.6* 12.3* 11.4*  HCT 37.0* 36.6* 33.8*  WBC 10.6* 11.3* 11.5*  PLT 376 364 326    COAGULATION Recent Labs  Lab 02/07/22 2111  INR 1.2    CARDIAC  No results for input(s): "TROPONINI" in the last 168 hours. No results for input(s): "PROBNP" in the last 168 hours.   CHEMISTRY Recent Labs  Lab 02/07/22 2111 02/07/22 2121 02/07/22 2226 02/08/22 0005 02/08/22 0415  NA 140 139 137  --  140  K 3.5 3.1* 3.1*  --  3.1*  CL 104 108  --   --  105  CO2 11*  --   --   --  21*  GLUCOSE 249* 246*  --   --  77  BUN 17 18  --   --  16  CREATININE 1.72* 1.40*  --  1.34* 1.27*  CALCIUM 9.4  --   --   --  9.3  MG  --   --   --   --  2.3  PHOS  --   --   --   --  2.3*   Estimated Creatinine Clearance: 67 mL/min (A) (by C-G formula based on SCr of 1.27 mg/dL (H)).   LIVER Recent Labs  Lab 02/07/22 2111  AST 55*  ALT 46*  ALKPHOS 89  BILITOT 2.3*  PROT 7.1  ALBUMIN 3.8  INR 1.2     INFECTIOUS Recent  Labs  Lab 02/08/22 0005 02/08/22 0415  LATICACIDVEN 1.9 1.4     ENDOCRINE CBG (last 3)  Recent Labs    02/08/22 0735 02/08/22 1134 02/08/22 1536  GLUCAP 85 136* 119*         IMAGING x48h  - image(s) personally visualized  -   highlighted in bold ECHOCARDIOGRAM COMPLETE  Result Date: 02/08/2022    ECHOCARDIOGRAM REPORT   Patient Name:   Justin Tanner Date of Exam: 02/08/2022 Medical Rec #:  [id number]          Height:       72.0 in Accession #:    [id number]         Weight:       188.9 lb Date of Birth:  18-Apr-1960           BSA:          2.080  m Patient Age:    61 years           BP:           98/67 mmHg Patient Gender: M                  HR:           80 bpm. Exam Location:  Inpatient Procedure: 2D Echo, Cardiac Doppler, Color Doppler and Intracardiac            Opacification Agent Indications:    Atrial fibrillation  History:        Patient has prior history of Echocardiogram examinations, most                 recent 04/09/2012. CAD, Arrythmias:Atrial Fibrillation; Risk                 Factors:Hypertension. Polysubstance abuse.  Sonographer:    Clayton Lefort RDCS (AE) Referring Phys: Elisabeth Cara SOOD  Sonographer Comments: Technically challenging study due to limited acoustic windows, Technically difficult study due to poor echo windows, suboptimal parasternal window, suboptimal apical window, suboptimal subcostal window and echo performed with patient supine and on artificial respirator. IMPRESSIONS  1. Left ventricular ejection fraction, by estimation, is 55 to 60%. The left ventricle has normal function. Left ventricular endocardial border not optimally defined to evaluate regional wall motion. Left ventricular diastolic parameters are indeterminate. Definity contrast utilized.  2. Right ventricular systolic function is moderately reduced. The right ventricular size is mildly enlarged. Tricuspid regurgitation signal is inadequate for assessing PA pressure.  3. The mitral valve is grossly  normal. Trivial mitral valve regurgitation.  4. The aortic valve is tricuspid. Aortic valve regurgitation is not visualized. Aortic valve mean gradient measures 2.0 mmHg.  5. Unable to estimate CVP on ventilator. Comparison(s): Prior images unable to be directly viewed. FINDINGS  Left Ventricle: Left ventricular ejection fraction, by estimation, is 55 to 60%. The left ventricle has normal function. Left ventricular endocardial border not optimally defined to evaluate regional wall motion. Definity contrast agent was given IV to delineate the left ventricular endocardial borders. The left ventricular internal cavity size was normal in size. There is no left ventricular hypertrophy. Left ventricular diastolic parameters are indeterminate. Right Ventricle: The right ventricular size is mildly enlarged. No increase in right ventricular wall thickness. Right ventricular systolic function is moderately reduced. Tricuspid regurgitation signal is inadequate for assessing PA pressure. Left Atrium: Left atrial size was normal in size. Right Atrium: Right atrial size was normal in size. Pericardium: There is no evidence of pericardial effusion. Mitral Valve: The mitral valve is grossly normal. Trivial mitral valve regurgitation. Tricuspid Valve: The tricuspid valve is not well visualized. Tricuspid valve regurgitation is trivial. Aortic Valve: The aortic valve is tricuspid. Aortic valve regurgitation is not visualized. Aortic valve mean gradient measures 2.0 mmHg. Aortic valve peak gradient measures 3.6 mmHg. Aortic valve area, by VTI measures 2.23 cm. Pulmonic Valve: The pulmonic valve was grossly normal. Pulmonic valve regurgitation is trivial. Aorta: The aortic root is normal in size and structure. Venous: Unable to estimate CVP on ventilator. IAS/Shunts: The interatrial septum was not well visualized.  LEFT VENTRICLE PLAX 2D LVIDd:         4.80 cm LVIDs:         4.00 cm LV PW:         1.00 cm LV IVS:        0.70 cm LVOT  diam:  1.90 cm LV SV:         38 LV SV Index:   18 LVOT Area:     2.84 cm  RIGHT VENTRICLE             IVC RV Basal diam:  3.40 cm     IVC diam: 1.90 cm RV S prime:     11.90 cm/s TAPSE (M-mode): 1.9 cm LEFT ATRIUM           Index       RIGHT ATRIUM           Index LA diam:      2.00 cm 0.96 cm/m  RA Area:     14.90 cm LA Vol (A4C): 19.5 ml 9.38 ml/m  RA Volume:   40.20 ml  19.33 ml/m  AORTIC VALVE AV Area (Vmax):    2.28 cm AV Area (Vmean):   2.21 cm AV Area (VTI):     2.23 cm AV Vmax:           95.50 cm/s AV Vmean:          62.700 cm/s AV VTI:            0.169 m AV Peak Grad:      3.6 mmHg AV Mean Grad:      2.0 mmHg LVOT Vmax:         76.80 cm/s LVOT Vmean:        48.800 cm/s LVOT VTI:          0.133 m LVOT/AV VTI ratio: 0.79  AORTA Ao Root diam: 3.00 cm  SHUNTS Systemic VTI:  0.13 m Systemic Diam: 1.90 cm Rozann Lesches MD Electronically signed by Rozann Lesches MD Signature Date/Time: 02/08/2022/1:53:54 PM    Final    Overnight EEG with video  Result Date: 02/08/2022 Lora Havens, MD     02/08/2022  9:22 AM Patient Name: GURJOT PEARDON MRN: [id number] Epilepsy Attending: Lora Havens Referring Physician/Provider: Donnetta Simpers, MD Duration: 02/07/2022 2303 to 02/08/2022 0447, 0730 to 0915 Patient history: 61 y.o. male with PMH significant for afibb and taken off AC in 2019 due to chads score of 1, HTN, HLD, obseity, prior cardiac arrest in 2014 during lumbar spine surgery who presents with confusion, concern for aphasia and seizure x 2.  EEG to evaluate for seizure. Level of alertness: comatose AEDs during EEG study: Propofol Technical aspects: This EEG study was done with scalp electrodes positioned according to the 10-20 International system of electrode placement. Electrical activity was reviewed with band pass filter of 1-70Hz , sensitivity of 7 uV/mm, display speed of 21mm/sec with a 60Hz  notched filter applied as appropriate. EEG data were recorded continuously and  digitally stored.  Video monitoring was available and reviewed as appropriate. Description: EEG showed continuous generalized 3 to 7 Hz theta-delta slowing admixed with an excessive amount of 15 to 18 Hz beta activity distributed symmetrically and diffusely. Hyperventilation and photic stimulation were not performed.   ABNORMALITY - Continuous slow, generalized - Excessive beta, generalized IMPRESSION: This study is suggestive of severe diffuse encephalopathy, nonspecific etiology but likely related to sedation. No seizures or epileptiform discharges were seen throughout the recording. Lora Havens   DG Chest Port 1 View  Result Date: 02/08/2022 CLINICAL DATA:  Respiratory failure. EXAM: PORTABLE CHEST 1 VIEW COMPARISON:  February 07, 2022. FINDINGS: The heart size and mediastinal contours are within normal limits. Endotracheal and nasogastric tubes are unchanged in position. Both lungs are clear. The  visualized skeletal structures are unremarkable. IMPRESSION: No active disease. Electronically Signed   By: Lupita Raider M.D.   On: 02/08/2022 08:11   CT ANGIO HEAD NECK W WO CM W PERF (CODE STROKE)  Result Date: 02/07/2022 CLINICAL DATA:  Stroke suspected, altered mental status EXAM: CT ANGIOGRAPHY HEAD AND NECK CT PERFUSION BRAIN TECHNIQUE: Multidetector CT imaging of the head and neck was performed using the standard protocol during bolus administration of intravenous contrast. Multiplanar CT image reconstructions and MIPs were obtained to evaluate the vascular anatomy. Carotid stenosis measurements (when applicable) are obtained utilizing NASCET criteria, using the distal internal carotid diameter as the denominator. Multiphase CT imaging of the brain was performed following IV bolus contrast injection. Subsequent parametric perfusion maps were calculated using RAPID software. RADIATION DOSE REDUCTION: This exam was performed according to the departmental dose-optimization program which includes  automated exposure control, adjustment of the mA and/or kV according to patient size and/or use of iterative reconstruction technique. CONTRAST:  OMNIPAQUE IOHEXOL 350 MG/ML SOLN COMPARISON:  No prior CTA, correlation is made with 02/07/2022 CT head FINDINGS: CT HEAD FINDINGS For noncontrast findings, please see same day CT head. CTA NECK FINDINGS Aortic arch: Standard branching. Imaged portion shows no evidence of aneurysm or dissection. No significant stenosis of the major arch vessel origins. Aortic atherosclerosis. Right carotid system: No evidence of stenosis, dissection, or occlusion. Left carotid system: No evidence of stenosis, dissection, or occlusion. Vertebral arteries: No evidence of stenosis, dissection, or occlusion. Skeleton: No acute osseous abnormality. Edentulous. Degenerative changes in the cervical spine. Status post ACDF C6-C7. Other neck: Endotracheal and orogastric tubes noted. No additional acute finding in the neck. Upper chest: No focal pulmonary opacity or pleural effusion. Mild centrilobular emphysema. Review of the MIP images confirms the above findings CTA HEAD FINDINGS Anterior circulation: Both internal carotid arteries are patent to the termini, without significant stenosis. A1 segments patent. Normal anterior communicating artery. Anterior cerebral arteries are patent to their distal aspects. No M1 stenosis or occlusion. MCA branches perfused, with somewhat diminished enhancement in the anterior right MCA branches compared to the left (series 8, image 79), although no focal stenosis is seen. Posterior circulation: Vertebral arteries patent to the vertebrobasilar junction without stenosis. Posterior inferior cerebellar arteries patent proximally. Basilar patent to its distal aspect. Superior cerebellar arteries patent proximally. Patent P1 segments. PCAs perfused to their distal aspects without stenosis. The right posterior communicating artery is not visualized. Venous  sinuses: As permitted by contrast timing, patent. Anatomic variants: None significant. Review of the MIP images confirms the above findings CT Brain Perfusion Findings: ASPECTS: 10 CBF (<30%) Volume: 105mL Perfusion (Tmax>6.0s) volume: 25mL Mismatch Volume: 57mL Infarction Location:None IMPRESSION: 1. No intracranial large vessel occlusion or significant stenosis. Somewhat diminished enhancement in the anterior right MCA branches compared to the left, although no focal stenosis is seen. 2. No hemodynamically significant stenosis in the neck. 3. No evidence of core infarct or penumbra on CT perfusion. 4. Aortic atherosclerosis. 5. Emphysema. Aortic Atherosclerosis (ICD10-I70.0) and Emphysema (ICD10-J43.9). Electronically Signed   By: Wiliam Ke M.D.   On: 02/07/2022 22:29   DG Chest Port 1 View  Result Date: 02/07/2022 CLINICAL DATA:  Status post intubation. EXAM: PORTABLE CHEST 1 VIEW COMPARISON:  None Available. FINDINGS: The heart size and mediastinal contours are within normal limits. Both lungs are clear. No acute osseous abnormality. Endotracheal tube approximately 5.5 cm above the carina. Feeding tube coursing below the diaphragm with distal tip projecting over the gastric  fundus. IMPRESSION: 1. Endotracheal tube approximately 5.5 cm above the carina. 2. Feeding tube coursing below the diaphragm with distal tip projecting over the gastric fundus. Electronically Signed   By: Keane Police D.O.   On: 02/07/2022 22:10   CT HEAD CODE STROKE WO CONTRAST  Result Date: 02/07/2022 CLINICAL DATA:  Code stroke.  Unresponsive EXAM: CT HEAD WITHOUT CONTRAST TECHNIQUE: Contiguous axial images were obtained from the base of the skull through the vertex without intravenous contrast. RADIATION DOSE REDUCTION: This exam was performed according to the departmental dose-optimization program which includes automated exposure control, adjustment of the mA and/or kV according to patient size and/or use of iterative  reconstruction technique. COMPARISON:  07/02/2013 CT head with contrast FINDINGS: Brain: No evidence of acute infarction, hemorrhage, cerebral edema, mass, mass effect, or midline shift. No hydrocephalus or extra-axial collection. Vascular: No hyperdense vessel. Skull: Negative for fracture or focal lesion. Sinuses/Orbits: Mucosal thickening in the ethmoid air cells and sphenoid sinuses. Mucous retention cysts in the maxillary sinuses. The orbits are unremarkable. Other: The mastoid air cells are well aerated. ASPECTS [facility] Stroke Program Early CT Score) - Ganglionic level infarction (caudate, lentiform nuclei, internal capsule, insula, M1-M3 cortex): 7 - Supraganglionic infarction (M4-M6 cortex): 3 Total score (0-10 with 10 being normal): 10 IMPRESSION: 1. No acute intracranial process. 2. ASPECTS is 10. Code stroke imaging results were communicated on 02/07/2022 at 10:07 pm to provider Dr. Lorrin Goodell via secure text paging. Electronically Signed   By: Merilyn Baba M.D.   On: 02/07/2022 22:08

## 2022-02-09 ENCOUNTER — Inpatient Hospital Stay (HOSPITAL_COMMUNITY): Payer: Medicare Other

## 2022-02-09 DIAGNOSIS — J9601 Acute respiratory failure with hypoxia: Secondary | ICD-10-CM | POA: Diagnosis not present

## 2022-02-09 DIAGNOSIS — R569 Unspecified convulsions: Secondary | ICD-10-CM | POA: Diagnosis not present

## 2022-02-09 LAB — BASIC METABOLIC PANEL
Anion gap: 15 (ref 5–15)
Anion gap: 14 (ref 5–15)
BUN: 12 mg/dL (ref 8–23)
BUN: 8 mg/dL (ref 8–23)
CO2: 16 mmol/L — ABNORMAL LOW (ref 22–32)
CO2: 21 mmol/L — ABNORMAL LOW (ref 22–32)
Calcium: 8 mg/dL — ABNORMAL LOW (ref 8.9–10.3)
Calcium: 8.4 mg/dL — ABNORMAL LOW (ref 8.9–10.3)
Chloride: 105 mmol/L (ref 98–111)
Chloride: 105 mmol/L (ref 98–111)
Creatinine, Ser: 1.5 mg/dL — ABNORMAL HIGH (ref 0.61–1.24)
Creatinine, Ser: 1.43 mg/dL — ABNORMAL HIGH (ref 0.61–1.24)
GFR, Estimated: 53 mL/min — ABNORMAL LOW (ref >60)
GFR, Estimated: 56 mL/min — ABNORMAL LOW (ref >60)
Glucose, Bld: 100 mg/dL — ABNORMAL HIGH (ref 70–99)
Glucose, Bld: 118 mg/dL — ABNORMAL HIGH (ref 70–99)
Potassium: 3.4 mmol/L — ABNORMAL LOW (ref 3.5–5.1)
Potassium: 3.9 mmol/L (ref 3.5–5.1)
Sodium: 136 mmol/L (ref 135–145)
Sodium: 140 mmol/L (ref 135–145)

## 2022-02-09 LAB — URINALYSIS, ROUTINE W REFLEX MICROSCOPIC
Bilirubin Urine: NEGATIVE
Glucose, UA: NEGATIVE mg/dL
Hgb urine dipstick: NEGATIVE
Ketones, ur: NEGATIVE mg/dL
Nitrite: NEGATIVE
Protein, ur: 30 mg/dL — AB
Specific Gravity, Urine: 1.036 — ABNORMAL HIGH (ref 1.005–1.030)
pH: 5 (ref 5.0–8.0)

## 2022-02-09 LAB — HEPATIC FUNCTION PANEL
ALT: 24 U/L (ref 0–44)
AST: 22 U/L (ref 15–41)
Albumin: 2.6 g/dL — ABNORMAL LOW (ref 3.5–5.0)
Alkaline Phosphatase: 70 U/L (ref 38–126)
Bilirubin, Direct: 1.1 mg/dL — ABNORMAL HIGH (ref 0.0–0.2)
Indirect Bilirubin: 0.8 mg/dL (ref 0.3–0.9)
Total Bilirubin: 1.9 mg/dL — ABNORMAL HIGH (ref 0.3–1.2)
Total Protein: 5.7 g/dL — ABNORMAL LOW (ref 6.5–8.1)

## 2022-02-09 LAB — LACTIC ACID, PLASMA
Lactic Acid, Venous: 2 mmol/L (ref 0.5–1.9)
Lactic Acid, Venous: 2.3 mmol/L (ref 0.5–1.9)

## 2022-02-09 LAB — GLUCOSE, CAPILLARY
Glucose-Capillary: 105 mg/dL — ABNORMAL HIGH (ref 70–99)
Glucose-Capillary: 108 mg/dL — ABNORMAL HIGH (ref 70–99)
Glucose-Capillary: 108 mg/dL — ABNORMAL HIGH (ref 70–99)
Glucose-Capillary: 110 mg/dL — ABNORMAL HIGH (ref 70–99)
Glucose-Capillary: 111 mg/dL — ABNORMAL HIGH (ref 70–99)
Glucose-Capillary: 84 mg/dL (ref 70–99)
Glucose-Capillary: 99 mg/dL (ref 70–99)

## 2022-02-09 LAB — CK TOTAL AND CKMB (NOT AT ARMC)
CK, MB: 2.1 ng/mL (ref 0.5–5.0)
Relative Index: 1.4 (ref 0.0–2.5)
Total CK: 149 U/L (ref 49–397)

## 2022-02-09 LAB — PHOSPHORUS: Phosphorus: 4 mg/dL (ref 2.5–4.6)

## 2022-02-09 LAB — MAGNESIUM: Magnesium: 1.9 mg/dL (ref 1.7–2.4)

## 2022-02-09 LAB — STREP PNEUMONIAE URINARY ANTIGEN: Strep Pneumo Urinary Antigen: NEGATIVE

## 2022-02-09 MED ORDER — DEXTROSE 50 % IV SOLN
25.0000 g | Freq: Once | INTRAVENOUS | Status: AC
  Administered 2022-02-09: 25 g via INTRAVENOUS

## 2022-02-09 MED ORDER — DEXTROSE IN LACTATED RINGERS 5 % IV SOLN: INTRAVENOUS | Status: DC

## 2022-02-09 MED ORDER — SODIUM CHLORIDE 0.9 % IV SOLN
2.0000 g | INTRAVENOUS | Status: DC
  Administered 2022-02-09 – 2022-02-12 (×4): 2 g via INTRAVENOUS
  Filled 2022-02-09 (×4): qty 20

## 2022-02-09 MED ORDER — DEXTROSE 50 % IV SOLN
INTRAVENOUS | Status: AC
  Filled 2022-02-09: qty 50

## 2022-02-09 MED ORDER — LACTATED RINGERS IV BOLUS
1000.0000 mL | Freq: Once | INTRAVENOUS | Status: AC
  Administered 2022-02-09: 1000 mL via INTRAVENOUS

## 2022-02-09 MED ORDER — LACTATED RINGERS IV BOLUS
500.0000 mL | Freq: Once | INTRAVENOUS | Status: AC
  Administered 2022-02-09: 500 mL via INTRAVENOUS

## 2022-02-09 MED ORDER — MAGNESIUM SULFATE IN D5W 1-5 GM/100ML-% IV SOLN
1.0000 g | Freq: Once | INTRAVENOUS | Status: AC
  Administered 2022-02-09: 1 g via INTRAVENOUS
  Filled 2022-02-09: qty 100

## 2022-02-09 MED ORDER — POTASSIUM CHLORIDE 20 MEQ PO PACK
40.0000 meq | PACK | Freq: Two times a day (BID) | ORAL | Status: AC
  Administered 2022-02-09 (×2): 40 meq
  Filled 2022-02-09 (×2): qty 2

## 2022-02-09 NOTE — Progress Notes (Signed)
eLink Physician-Brief Progress Note Patient Name: Justin Tanner DOB: 1960-04-12 MRN: [id number]   Date of Service  02/09/2022  HPI/Events of Note  LA 2.3, and dark urine with low output Borderline glucose on LR  Restraints requested  eICU Interventions  LR bolus 500 cc  Add D5 LR instead of LR  Restraints ordered      Intervention Category Major Interventions: Respiratory failure - evaluation and management  Oretha Milch 02/09/2022, 8:22 PM

## 2022-02-09 NOTE — Progress Notes (Signed)
Hypoglycemic Event  CBG: 58  Treatment: D50 25 mL (12.5 gm)  Symptoms: None  Follow-up CBG: Time:2358 CBG Result:99  Possible Reasons for Event: Inadequate meal intake  Comments/MD notified:E-link    Kathlene November

## 2022-02-09 NOTE — Procedures (Signed)
Patient Name: Justin Tanner  MRN: [id number]  Epilepsy Attending: Charlsie Quest  Referring Physician/Provider: Erick Blinks, MD  Duration: 02/08/2022 2303 to 02/09/2022 0445   Patient history: 61 y.o. male with PMH significant for afibb and taken off AC in 2019 due to chads score of 1, HTN, HLD, obseity, prior cardiac arrest in 2014 during lumbar spine surgery who presents with confusion, concern for aphasia and seizure x 2.  EEG to evaluate for seizure.   Level of alertness: awake, asleep   AEDs during EEG study: None   Technical aspects: This EEG study was done with scalp electrodes positioned according to the 10-20 International system of electrode placement. Electrical activity was reviewed with band pass filter of 1-70Hz , sensitivity of 7 uV/mm, display speed of 58mm/sec with a 60Hz  notched filter applied as appropriate. EEG data were recorded continuously and digitally stored.  Video monitoring was available and reviewed as appropriate.   Description: The posterior dominant rhythm  consists of 9-10 Hz activity of moderate voltage (25-35 uV) seen predominantly in posterior head regions, symmetric and reactive to eye opening and eye closing. Sleep was characterized by vertex waves, sleep spindles (12 to 14 Hz), maximal frontocentral region.  Hyperventilation and photic stimulation were not performed.      IMPRESSION: This study was within normal limits. No seizures or epileptiform discharges were seen throughout the recording.   Ardice Boyan 

## 2022-02-09 NOTE — Progress Notes (Signed)
Pt transported to CT and back on vent without complications  

## 2022-02-09 NOTE — Progress Notes (Signed)
Pt was transported to MRI and back without complications.

## 2022-02-09 NOTE — Progress Notes (Signed)
LTM EEG discontinued - no skin breakdown at unhook.   

## 2022-02-09 NOTE — Progress Notes (Signed)
Subjective: Awakens easily, no further seizures  Exam: Vitals:   02/09/22 0811 02/09/22 0900  BP: 98/66 95/64  Pulse:  69  Resp:  18  Temp:  (!) 100.4 F (38 C)  SpO2:  100%   Gen: In bed, intubated No meningismus  Neuro: MS: Opens eyes to mild stimulation, follows some commands CN: Pupils equal round and reactive, extraocular movements intact Motor: Moves all extremities spontaneously and to command Sensory: Response to mild noxious stimulation in all four extremities  Pertinent Labs: UDS positive for methamphetamine  Impression: 61 year old male who presented with altered mental status and seizures.  He has a strong history of methamphetamine use including going on "binges."  I suspect that drug intoxication is the likely etiology of his altered mental status/seizures.  He has been afebrile, and his mild leukocytosis is nonspecific.  He has no meningismus.  I suspect that CNS infection is very unlikely, and we have scaled back on antibiotics.  1) MRI brain 2) given provoked seizure, will hold off on scheduled antiepileptics 3) neurology will continue to follow  Ritta Slot, MD [facility] [phone]  If 7pm- 7am, please page neurology on call as listed in AMION. 02/09/2022  12:05 PM

## 2022-02-09 NOTE — Progress Notes (Signed)
NAME:  Justin Tanner, MRN:  [id number], DOB:  03/22/1960, LOS: 1 ADMISSION DATE:  02/07/2022, CONSULTATION DATE:  02/07/2022 REFERRING MD:  Silverio Lay , CHIEF COMPLAINT: Altered mental status  History of Present Illness:  Patient is encephalopathic and/or intubated. Therefore history has been obtained from chart review and   Justin Tanner, is a 61 y.o. male, who presented to the [facility] ED with a chief complaint of altered mental status  They have a pertinent past medical history of polysubstance abuse, CAD, HLD, HTN, Afib  Per sister patient has been increasingly reclusive at home.  Lost 60 to 70 pounds over the past few months.  Reportedly "slept the whole month of October."  Reportedly been using meth with increasing frequency.  On 11/24 patient walks to sisters house and presented with aphasia, which progressed to the seizure-like activity.  EMS was called.  ED course was notable for activation of a code stroke.  CT head negative for LVO or acute changes. EEG was started.  UDS positive for amphetamines, THC.  Patient was found to be in A-fib.  Cardioversion was attempted with conversion to sinus rhythm but conversion back to A-fib.  He was intubated post cardioversion. Keppra load given. Afebrile. No documented hypotension. WBC 19.1. Empiric meningitis coverage started by neuro.  PCCM was consulted for admission.   Pertinent  Medical History  CAD, HLD, HTN, Afib  Significant Hospital Events: Including procedures, antibiotic start and stop dates in addition to other pertinent events   11/24 Admit. PCCM consult. EEG>, CTH neg>, Intubated>, Vanc/Ceftriaxone> Dilt 5mg , prop, 50, fent 50, versed 3 > Got keppr ain ED  11/25 -currently remains on ventilator FiO2 30%.  He is on propofol infusion Fentanyl infusion. Jsut off Versed infusion. Now in Sinus:  and Cardizem drip off.  On thiamine. -   Has low-grade fever.   White count 11.3 K.  Culture negative so far.  On antibiotics ceftriaxone and  vancomycin.  On c-EEG. No anti-epileptics currently. UDS+ for THC, amphetatmines, benzos. Followed commands for RN RVP negative Tracheal aspirate: Polymicrobial Gram stain positive MRSA PCR negative   Interim History / Subjective:   11/26-off all antibiotics because of low suspicion for meningitis.  Overnight hypoglycemic and D50 given.  EEG yesterday showed slow generalized excessive beta waves.  Consistent with severe diffuse encephalopathy but no seizures.  He has temperature to 101 Fahrenheit with white count of 11.5 K.  Lactic acid up to 2 . Tracheal aspirate growing gram-positive cocci and rare gram-negative rods. Remain on Vent - diprivan gtt- > able to follow command but gets agitated. 02-24-1998 Restarted fent gtt for MRI later today,.  Creat worse - urine looking dark brown v red  Objective   Blood pressure 95/64, pulse 69, temperature (!) 100.4 F (38 C), resp. rate 18, height 6' (1.829 m), weight 85.7 kg, SpO2 100 %.    Vent Mode: PRVC FiO2 (%):  [30 %] 30 % Set Rate:  [18 bmp] 18 bmp Vt Set:  [620 mL] 620 mL PEEP:  [5 cmH20] 5 cmH20 Plateau Pressure:  [15 cmH20-16 cmH20] 15 cmH20   Intake/Output Summary (Last 24 hours) at 02/09/2022 1056 Last data filed at 02/09/2022 0834 Gross per 24 hour  Intake 5113.6 ml  Output 1050 ml  Net 4063.6 ml   Filed Weights   02/07/22 2111 02/08/22 0229  Weight: 104 kg 85.7 kg    General Appearance:  Looks criticall il Head:  Normocephalic, without obvious abnormality, atraumatic Eyes:  PERRL -  yes, conjunctiva/corneas - muddu     Ears:  Normal external ear canals, both ears Nose:  G tube - no Throat:  ETT TUBE - yes , OG tube - yew Neck:  Supple,  No enlargement/tenderness/nodules Lungs: Clear to auscultation bilaterally, Ventilator   Synchrony - yes Heart:  S1 and S2 normal, no murmur, CVP - no.  Pressors - no Abdomen:  Soft, no masses, no organomegaly Genitalia / Rectal:  Not done Extremities:  Extremities- intact3 Skin:  ntact in  exposed areas . Sacral area - not examind Neurologic:  Sedation - fent gtt, diprivan gtt -> RASS - -3 . Moves all 4s - yes. CAM-ICU - cannot test . Orientation - did follow commands        Resolved Hospital Problem list     Assessment & Plan:  Seizure, suspect secondary to methamphetamine toxicity Acute agitated metabolic encephalopathy secondary to above Polysubstance abuse UDS prior degenerative for THC, amphetamines.  Patient with severe agitation not controlled by multiple medications. Afebrile. No documented hypotension. WBC 19.1.  Family history supporting increased drug use.  Head CT negative for LVO or acute changes.  B12 /Folatelevel normal. TSH noram  11/26 - -Low suspicion for meningitis per neurology and antibiotics stopped.  High suspicion for methamphetamine toxicity.  EEG with severe encephalopathy.  Does follow commands but does get agitated during sedation wean.  Off antiepileptics   Plan - Appreciate neurology input -Propofol infusion infusion -Fentanyl infusion -Monitor off antiepileptics - await MRI 02/09/22 -RASS sedation score 0 to -2 -Await synthetic opioid panel   SIRS with likely sepsis (fever, wbc)  - Present on Admit - intialy Empiric meningitis Rx and Rx stopped 11/25 due to low concern  11/26 -continues to have fever with rising white count and also new onset lactic acidosis.  Tracheal aspirate with polymicrobial Gram stain positive   Plan -Check urine strep Check urine Legionella Check procalcitonin Recheck lactate Start empiric ceftriaxone 02/09/22     Chronic systolic heart failure ejection fraction 45% in 2014  02/09/2022: Looks dry.  Troponin normal at admit.  Echo with RV dysfunction present but normal LV  Plan Check D-dimer - Check Doppler lower extremities - Cannot get CTA because of AKI  Hypertension/hyperlipidemia/coronary artery disease not otherwise specified Atrial fibrillation upon admission -status post cardioversion  in the ER - Per chart review looks like patient had AC stopped in 2019 due to CHA2DS2-VASc of 1.  Suspect A-fib worsened due to methamphetamine use.  02/09/22: In sinus rhythm and Cardizem off since 02/08/2022   Plan - Telemetry -Hold home antihypertensives Continue baby aspirin  Acute respiratory failure secondary to acute metabolic encephalopathy - GCS of 8 in the ED.  Intubated post cardioversion for cardioversion  02/09/2022 - > does yes meet criteria for SBT but gets agitated and so cannot extubate  Plan - Wean sedation as tolerated - If doing well can extubate  02/10/22    AKI - Creat 1.92 (baseline 0.9-1).  Suspect prerenal New onset maroon urine 02/09/22  02/09/2022: Creatinine worse again 1.5mg %   Plan - rule out rhabdo - check CK  -chek UA and urine culture -Ensure renal perfusion. Goal MAP 65 or greater. -Avoid neprotoxic drugs as possible. -Strict I&O's -continue foley  Hypomagnesemia -  mild Hypokalemia mild  Plan  - replete 02/09/22  Hyperglycemia Glucose 249 -Blood Glucose goal 140-180. -SSI  Transaminitis Bilirubinemia -Supportive care as above -Trend  Anemia - onset 02/07/22  11/26 - no active bleed  Plan   -  PRBC for hgb </= 6.9gm%    - exceptions are   -  if ACS susepcted/confirmed then transfuse for hgb </= 8.0gm%,  or    -  active bleeding with hemodynamic instability, then transfuse regardless of hemoglobin value   At at all times try to transfuse 1 unit prbc as possible with exception of active hemorrhage   Suspect malnutrition, unspecified- at risk refeeding syndeome Failure to thrive Unintentional weight loss Depression Substance abuse Wife died in Nov 07, 2022. 60 to 70 pounds weight loss over the past few months.  Increased substance abuse -RD consult -Monitor for refeedign  Best Practice (right click and "Reselect all SmartList Selections" daily)   Diet/type: NPO -continue  tube feeds DVT prophylaxis: prophylactic heparin   GI prophylaxis: N/A Lines: N/A Foley:  Yes, and it is still needed Code Status:  full code Last date of multidisciplinary goals of care discussion  - [discussed with sister on 11/24.  Unsure if patient wishes at this time continue full scope of care.] -11/25 - Call Sister Hart [place] [phone] - updated. Described him as a stubborn strong willed person - 11/26 - sister updated       ATTESTATION & SIGNATURE   The patient MAYA SCHOLER is critically ill with multiple organ systems failure and requires high complexity decision making for assessment and support, frequent evaluation and titration of therapies, application of advanced monitoring technologies and extensive interpretation of multiple databases.   Critical Care Time devoted to patient care services described in this note is  30  Minutes. This time reflects time of care of this signee Dr Kalman Shan. This critical care time does not reflect procedure time, or teaching time or supervisory time of PA/NP/Med student/Med Resident etc but could involve care discussion time     Dr. Kalman Shan, M.D., [facility].C.P Pulmonary and Critical Care Medicine Medical Director - [facility] ICU Staff Physician, [facility] Pulmonary and Critical Care Pager: [phone], If no answer or between  15:00h - 7:00h: call [phone]  02/09/2022 11:19 AM   LABS    PULMONARY Recent Labs  Lab 02/07/22 2121 02/07/22 2226 02/08/22 0005  PHART  --  7.445  --   PCO2ART  --  35.4  --   PO2ART  --  546*  --   HCO3  --  24.5  --   TCO2 13* 26  --   O2SAT  --  100 89.1    CBC Recent Labs  Lab 02/08/22 0005 02/08/22 0415 02/08/22 1147  HGB 12.6* 12.3* 11.4*  HCT 37.0* 36.6* 33.8*  WBC 10.6* 11.3* 11.5*  PLT 376 364 326    COAGULATION Recent Labs  Lab 02/07/22 2111  INR 1.2    CARDIAC  No results for input(s): "TROPONINI" in the last 168 hours. No results for input(s): "PROBNP" in  the last 168 hours.   CHEMISTRY Recent Labs  Lab 02/07/22 2111 02/07/22 2121 02/07/22 2226 02/08/22 0005 02/08/22 0415 02/09/22 0649  NA 140 139 137  --  140 136  K 3.5 3.1* 3.1*  --  3.1* 3.4*  CL 104 108  --   --  105 105  CO2 11*  --   --   --  21* 16*  GLUCOSE 249* 246*  --   --  77 100*  BUN 17 18  --   --  16 12  CREATININE 1.72* 1.40*  --  1.34* 1.27* 1.50*  CALCIUM  9.4  --   --   --  9.3 8.0*  MG  --   --   --   --  2.3 1.9  PHOS  --   --   --   --  2.3* 4.0   Estimated Creatinine Clearance: 56.8 mL/min (A) (by C-G formula based on SCr of 1.5 mg/dL (H)).   LIVER Recent Labs  Lab 02/07/22 2111 02/09/22 0649  AST 55* 22  ALT 46* 24  ALKPHOS 89 70  BILITOT 2.3* 1.9*  PROT 7.1 5.7*  ALBUMIN 3.8 2.6*  INR 1.2  --      INFECTIOUS Recent Labs  Lab 02/08/22 0005 02/08/22 0415 02/09/22 0649  LATICACIDVEN 1.9 1.4 2.0*     ENDOCRINE CBG (last 3)  Recent Labs    02/08/22 2358 02/09/22 0315 02/09/22 0752  GLUCAP 99 105* 108*         IMAGING x48h  - image(s) personally visualized  -   highlighted in bold ECHOCARDIOGRAM COMPLETE  Result Date: 02/08/2022    ECHOCARDIOGRAM REPORT   Patient Name:   Justin Tanner Date of Exam: 02/08/2022 Medical Rec #:  [id number]          Height:       72.0 in Accession #:    [id number]         Weight:       188.9 lb Date of Birth:  1960-11-28           BSA:          2.080 m Patient Age:    61 years           BP:           98/67 mmHg Patient Gender: M                  HR:           80 bpm. Exam Location:  Inpatient Procedure: 2D Echo, Cardiac Doppler, Color Doppler and Intracardiac            Opacification Agent Indications:    Atrial fibrillation  History:        Patient has prior history of Echocardiogram examinations, most                 recent 04/06/2012. CAD, Arrythmias:Atrial Fibrillation; Risk                 Factors:Hypertension. Polysubstance abuse.  Sonographer:    Ross Ludwig RDCS (AE) Referring Phys: Laurier Nancy  SOOD  Sonographer Comments: Technically challenging study due to limited acoustic windows, Technically difficult study due to poor echo windows, suboptimal parasternal window, suboptimal apical window, suboptimal subcostal window and echo performed with patient supine and on artificial respirator. IMPRESSIONS  1. Left ventricular ejection fraction, by estimation, is 55 to 60%. The left ventricle has normal function. Left ventricular endocardial border not optimally defined to evaluate regional wall motion. Left ventricular diastolic parameters are indeterminate. Definity contrast utilized.  2. Right ventricular systolic function is moderately reduced. The right ventricular size is mildly enlarged. Tricuspid regurgitation signal is inadequate for assessing PA pressure.  3. The mitral valve is grossly normal. Trivial mitral valve regurgitation.  4. The aortic valve is tricuspid. Aortic valve regurgitation is not visualized. Aortic valve mean gradient measures 2.0 mmHg.  5. Unable to estimate CVP on ventilator. Comparison(s): Prior images unable to be directly viewed. FINDINGS  Left Ventricle: Left ventricular ejection fraction, by estimation, is 55 to 60%. The  left ventricle has normal function. Left ventricular endocardial border not optimally defined to evaluate regional wall motion. Definity contrast agent was given IV to delineate the left ventricular endocardial borders. The left ventricular internal cavity size was normal in size. There is no left ventricular hypertrophy. Left ventricular diastolic parameters are indeterminate. Right Ventricle: The right ventricular size is mildly enlarged. No increase in right ventricular wall thickness. Right ventricular systolic function is moderately reduced. Tricuspid regurgitation signal is inadequate for assessing PA pressure. Left Atrium: Left atrial size was normal in size. Right Atrium: Right atrial size was normal in size. Pericardium: There is no evidence of  pericardial effusion. Mitral Valve: The mitral valve is grossly normal. Trivial mitral valve regurgitation. Tricuspid Valve: The tricuspid valve is not well visualized. Tricuspid valve regurgitation is trivial. Aortic Valve: The aortic valve is tricuspid. Aortic valve regurgitation is not visualized. Aortic valve mean gradient measures 2.0 mmHg. Aortic valve peak gradient measures 3.6 mmHg. Aortic valve area, by VTI measures 2.23 cm. Pulmonic Valve: The pulmonic valve was grossly normal. Pulmonic valve regurgitation is trivial. Aorta: The aortic root is normal in size and structure. Venous: Unable to estimate CVP on ventilator. IAS/Shunts: The interatrial septum was not well visualized.  LEFT VENTRICLE PLAX 2D LVIDd:         4.80 cm LVIDs:         4.00 cm LV PW:         1.00 cm LV IVS:        0.70 cm LVOT diam:     1.90 cm LV SV:         38 LV SV Index:   18 LVOT Area:     2.84 cm  RIGHT VENTRICLE             IVC RV Basal diam:  3.40 cm     IVC diam: 1.90 cm RV S prime:     11.90 cm/s TAPSE (M-mode): 1.9 cm LEFT ATRIUM           Index       RIGHT ATRIUM           Index LA diam:      2.00 cm 0.96 cm/m  RA Area:     14.90 cm LA Vol (A4C): 19.5 ml 9.38 ml/m  RA Volume:   40.20 ml  19.33 ml/m  AORTIC VALVE AV Area (Vmax):    2.28 cm AV Area (Vmean):   2.21 cm AV Area (VTI):     2.23 cm AV Vmax:           95.50 cm/s AV Vmean:          62.700 cm/s AV VTI:            0.169 m AV Peak Grad:      3.6 mmHg AV Mean Grad:      2.0 mmHg LVOT Vmax:         76.80 cm/s LVOT Vmean:        48.800 cm/s LVOT VTI:          0.133 m LVOT/AV VTI ratio: 0.79  AORTA Ao Root diam: 3.00 cm  SHUNTS Systemic VTI:  0.13 m Systemic Diam: 1.90 cm Nona Dell MD Electronically signed by Nona Dell MD Signature Date/Time: 02/08/2022/1:53:54 PM    Final    Overnight EEG with video  Result Date: 02/08/2022 Charlsie Quest, MD     02/08/2022  9:22 AM Patient Name: Justin Tanner MRN: [id number] Epilepsy  Attending: Charlsie QuestPriyanka O  Yadav Referring Physician/Provider: Erick BlinksKhaliqdina, Salman, MD Duration: 02/07/2022 2303 to 02/08/2022 0447, 0730 to 0915 Patient history: 61 y.o. male with PMH significant for afibb and taken off AC in 2019 due to chads score of 1, HTN, HLD, obseity, prior cardiac arrest in 2014 during lumbar spine surgery who presents with confusion, concern for aphasia and seizure x 2.  EEG to evaluate for seizure. Level of alertness: comatose AEDs during EEG study: Propofol Technical aspects: This EEG study was done with scalp electrodes positioned according to the 10-20 International system of electrode placement. Electrical activity was reviewed with band pass filter of 1-70Hz , sensitivity of 7 uV/mm, display speed of 4230mm/sec with a 60Hz  notched filter applied as appropriate. EEG data were recorded continuously and digitally stored.  Video monitoring was available and reviewed as appropriate. Description: EEG showed continuous generalized 3 to 7 Hz theta-delta slowing admixed with an excessive amount of 15 to 18 Hz beta activity distributed symmetrically and diffusely. Hyperventilation and photic stimulation were not performed.   ABNORMALITY - Continuous slow, generalized - Excessive beta, generalized IMPRESSION: This study is suggestive of severe diffuse encephalopathy, nonspecific etiology but likely related to sedation. No seizures or epileptiform discharges were seen throughout the recording. Charlsie QuestPriyanka O Yadav   DG Chest Port 1 View  Result Date: 02/08/2022 CLINICAL DATA:  Respiratory failure. EXAM: PORTABLE CHEST 1 VIEW COMPARISON:  February 07, 2022. FINDINGS: The heart size and mediastinal contours are within normal limits. Endotracheal and nasogastric tubes are unchanged in position. Both lungs are clear. The visualized skeletal structures are unremarkable. IMPRESSION: No active disease. Electronically Signed   By: Lupita RaiderJames  Green Jr M.D.   On: 02/08/2022 08:11   CT ANGIO HEAD NECK W WO CM W PERF (CODE  STROKE)  Result Date: 02/07/2022 CLINICAL DATA:  Stroke suspected, altered mental status EXAM: CT ANGIOGRAPHY HEAD AND NECK CT PERFUSION BRAIN TECHNIQUE: Multidetector CT imaging of the head and neck was performed using the standard protocol during bolus administration of intravenous contrast. Multiplanar CT image reconstructions and MIPs were obtained to evaluate the vascular anatomy. Carotid stenosis measurements (when applicable) are obtained utilizing NASCET criteria, using the distal internal carotid diameter as the denominator. Multiphase CT imaging of the brain was performed following IV bolus contrast injection. Subsequent parametric perfusion maps were calculated using RAPID software. RADIATION DOSE REDUCTION: This exam was performed according to the departmental dose-optimization program which includes automated exposure control, adjustment of the mA and/or kV according to patient size and/or use of iterative reconstruction technique. CONTRAST:  100mL OMNIPAQUE IOHEXOL 350 MG/ML SOLN COMPARISON:  No prior CTA, correlation is made with 02/07/2022 CT head FINDINGS: CT HEAD FINDINGS For noncontrast findings, please see same day CT head. CTA NECK FINDINGS Aortic arch: Standard branching. Imaged portion shows no evidence of aneurysm or dissection. No significant stenosis of the major arch vessel origins. Aortic atherosclerosis. Right carotid system: No evidence of stenosis, dissection, or occlusion. Left carotid system: No evidence of stenosis, dissection, or occlusion. Vertebral arteries: No evidence of stenosis, dissection, or occlusion. Skeleton: No acute osseous abnormality. Edentulous. Degenerative changes in the cervical spine. Status post ACDF C6-C7. Other neck: Endotracheal and orogastric tubes noted. No additional acute finding in the neck. Upper chest: No focal pulmonary opacity or pleural effusion. Mild centrilobular emphysema. Review of the MIP images confirms the above findings CTA HEAD FINDINGS  Anterior circulation: Both internal carotid arteries are patent to the termini, without significant stenosis. A1 segments patent. Normal anterior communicating artery. Anterior  cerebral arteries are patent to their distal aspects. No M1 stenosis or occlusion. MCA branches perfused, with somewhat diminished enhancement in the anterior right MCA branches compared to the left (series 8, image 79), although no focal stenosis is seen. Posterior circulation: Vertebral arteries patent to the vertebrobasilar junction without stenosis. Posterior inferior cerebellar arteries patent proximally. Basilar patent to its distal aspect. Superior cerebellar arteries patent proximally. Patent P1 segments. PCAs perfused to their distal aspects without stenosis. The right posterior communicating artery is not visualized. Venous sinuses: As permitted by contrast timing, patent. Anatomic variants: None significant. Review of the MIP images confirms the above findings CT Brain Perfusion Findings: ASPECTS: 10 CBF (<30%) Volume: 81mL Perfusion (Tmax>6.0s) volume: 49mL Mismatch Volume: 27mL Infarction Location:None IMPRESSION: 1. No intracranial large vessel occlusion or significant stenosis. Somewhat diminished enhancement in the anterior right MCA branches compared to the left, although no focal stenosis is seen. 2. No hemodynamically significant stenosis in the neck. 3. No evidence of core infarct or penumbra on CT perfusion. 4. Aortic atherosclerosis. 5. Emphysema. Aortic Atherosclerosis (ICD10-I70.0) and Emphysema (ICD10-J43.9). Electronically Signed   By: Wiliam Ke M.D.   On: 02/07/2022 22:29   DG Chest Port 1 View  Result Date: 02/07/2022 CLINICAL DATA:  Status post intubation. EXAM: PORTABLE CHEST 1 VIEW COMPARISON:  None Available. FINDINGS: The heart size and mediastinal contours are within normal limits. Both lungs are clear. No acute osseous abnormality. Endotracheal tube approximately 5.5 cm above the carina. Feeding tube  coursing below the diaphragm with distal tip projecting over the gastric fundus. IMPRESSION: 1. Endotracheal tube approximately 5.5 cm above the carina. 2. Feeding tube coursing below the diaphragm with distal tip projecting over the gastric fundus. Electronically Signed   By: Larose Hires D.O.   On: 02/07/2022 22:10   CT HEAD CODE STROKE WO CONTRAST  Result Date: 02/07/2022 CLINICAL DATA:  Code stroke.  Unresponsive EXAM: CT HEAD WITHOUT CONTRAST TECHNIQUE: Contiguous axial images were obtained from the base of the skull through the vertex without intravenous contrast. RADIATION DOSE REDUCTION: This exam was performed according to the departmental dose-optimization program which includes automated exposure control, adjustment of the mA and/or kV according to patient size and/or use of iterative reconstruction technique. COMPARISON:  07/02/2013 CT head with contrast FINDINGS: Brain: No evidence of acute infarction, hemorrhage, cerebral edema, mass, mass effect, or midline shift. No hydrocephalus or extra-axial collection. Vascular: No hyperdense vessel. Skull: Negative for fracture or focal lesion. Sinuses/Orbits: Mucosal thickening in the ethmoid air cells and sphenoid sinuses. Mucous retention cysts in the maxillary sinuses. The orbits are unremarkable. Other: The mastoid air cells are well aerated. ASPECTS [facility] Stroke Program Early CT Score) - Ganglionic level infarction (caudate, lentiform nuclei, internal capsule, insula, M1-M3 cortex): 7 - Supraganglionic infarction (M4-M6 cortex): 3 Total score (0-10 with 10 being normal): 10 IMPRESSION: 1. No acute intracranial process. 2. ASPECTS is 10. Code stroke imaging results were communicated on 02/07/2022 at 10:07 pm to provider Dr. Derry Lory via secure text paging. Electronically Signed   By: Wiliam Ke M.D.   On: 02/07/2022 22:08

## 2022-02-09 NOTE — Progress Notes (Signed)
eLink Physician-Brief Progress Note Patient Name: Justin Tanner DOB: April 28, 1960 MRN: [id number]   Date of Service  02/09/2022  HPI/Events of Note  Notified of hypoglycemia, received 1/2 D50 per protocol. Glucose currently 99  eICU Interventions  Placed order to give 1amp D50 at this time.  Will continue to monitor glucose trends.         Gilda Abboud M DELA CRUZ 02/09/2022, 12:15 AM

## 2022-02-10 ENCOUNTER — Inpatient Hospital Stay (HOSPITAL_COMMUNITY): Payer: Medicare Other

## 2022-02-10 DIAGNOSIS — R600 Localized edema: Secondary | ICD-10-CM

## 2022-02-10 DIAGNOSIS — R569 Unspecified convulsions: Secondary | ICD-10-CM | POA: Diagnosis not present

## 2022-02-10 LAB — CBC
HCT: 31.3 % — ABNORMAL LOW (ref 39.0–52.0)
Hemoglobin: 10.8 g/dL — ABNORMAL LOW (ref 13.0–17.0)
MCH: 29.1 pg (ref 26.0–34.0)
MCHC: 34.5 g/dL (ref 30.0–36.0)
MCV: 84.4 fL (ref 80.0–100.0)
Platelets: 226 10*3/uL (ref 150–400)
RBC: 3.71 MIL/uL — ABNORMAL LOW (ref 4.22–5.81)
RDW: 14.4 % (ref 11.5–15.5)
WBC: 11.5 10*3/uL — ABNORMAL HIGH (ref 4.0–10.5)
nRBC: 0 % (ref 0.0–0.2)

## 2022-02-10 LAB — COMPREHENSIVE METABOLIC PANEL
ALT: 31 U/L (ref 0–44)
AST: 32 U/L (ref 15–41)
Albumin: 2.2 g/dL — ABNORMAL LOW (ref 3.5–5.0)
Alkaline Phosphatase: 90 U/L (ref 38–126)
Anion gap: 11 (ref 5–15)
BUN: 6 mg/dL — ABNORMAL LOW (ref 8–23)
CO2: 18 mmol/L — ABNORMAL LOW (ref 22–32)
Calcium: 7.7 mg/dL — ABNORMAL LOW (ref 8.9–10.3)
Chloride: 106 mmol/L (ref 98–111)
Creatinine, Ser: 1.29 mg/dL — ABNORMAL HIGH (ref 0.61–1.24)
GFR, Estimated: >60 mL/min (ref >60)
Glucose, Bld: 146 mg/dL — ABNORMAL HIGH (ref 70–99)
Potassium: 3.5 mmol/L (ref 3.5–5.1)
Sodium: 135 mmol/L (ref 135–145)
Total Bilirubin: 3.1 mg/dL — ABNORMAL HIGH (ref 0.3–1.2)
Total Protein: 5.2 g/dL — ABNORMAL LOW (ref 6.5–8.1)

## 2022-02-10 LAB — LEGIONELLA PNEUMOPHILA SEROGP 1 UR AG: L. pneumophila Serogp 1 Ur Ag: NEGATIVE

## 2022-02-10 LAB — D-DIMER, QUANTITATIVE: D-Dimer, Quant: 1.29 ug/mL-FEU — ABNORMAL HIGH (ref 0.00–0.50)

## 2022-02-10 LAB — GLUCOSE, CAPILLARY
Glucose-Capillary: 112 mg/dL — ABNORMAL HIGH (ref 70–99)
Glucose-Capillary: 141 mg/dL — ABNORMAL HIGH (ref 70–99)
Glucose-Capillary: 148 mg/dL — ABNORMAL HIGH (ref 70–99)
Glucose-Capillary: 156 mg/dL — ABNORMAL HIGH (ref 70–99)
Glucose-Capillary: 71 mg/dL (ref 70–99)
Glucose-Capillary: 95 mg/dL (ref 70–99)

## 2022-02-10 LAB — CK TOTAL AND CKMB (NOT AT ARMC)
CK, MB: 1.6 ng/mL (ref 0.5–5.0)
Relative Index: 1.4 (ref 0.0–2.5)
Total CK: 111 U/L (ref 49–397)

## 2022-02-10 LAB — URINE CULTURE: Culture: NO GROWTH

## 2022-02-10 LAB — VITAMIN B1: Vitamin B1 (Thiamine): 142.2 nmol/L (ref 66.5–200.0)

## 2022-02-10 LAB — PROCALCITONIN: Procalcitonin: 0.17 ng/mL

## 2022-02-10 LAB — MAGNESIUM: Magnesium: 1.8 mg/dL (ref 1.7–2.4)

## 2022-02-10 LAB — HEMOGLOBIN A1C
Hgb A1c MFr Bld: 6.2 % — ABNORMAL HIGH (ref 4.8–5.6)
Mean Plasma Glucose: 131 mg/dL

## 2022-02-10 LAB — PHOSPHORUS: Phosphorus: 4.7 mg/dL — ABNORMAL HIGH (ref 2.5–4.6)

## 2022-02-10 LAB — LACTIC ACID, PLASMA: Lactic Acid, Venous: 1.4 mmol/L (ref 0.5–1.9)

## 2022-02-10 MED ORDER — ORAL CARE MOUTH RINSE: 15.0000 mL | OROMUCOSAL | Status: DC | PRN

## 2022-02-10 MED ORDER — POTASSIUM CHLORIDE 20 MEQ PO PACK
40.0000 meq | PACK | Freq: Once | ORAL | Status: AC
  Administered 2022-02-10: 40 meq
  Filled 2022-02-10: qty 2

## 2022-02-10 MED ORDER — ASPIRIN 81 MG PO CHEW
81.0000 mg | CHEWABLE_TABLET | Freq: Every day | ORAL | Status: DC
  Administered 2022-02-11 – 2022-02-12 (×2): 81 mg via ORAL
  Filled 2022-02-10 (×2): qty 1

## 2022-02-10 MED ORDER — ACETAMINOPHEN 325 MG PO TABS: 650.0000 mg | ORAL_TABLET | Freq: Four times a day (QID) | ORAL | Status: DC | PRN

## 2022-02-10 MED ORDER — DOCUSATE SODIUM 100 MG PO CAPS: 100.0000 mg | ORAL_CAPSULE | Freq: Two times a day (BID) | ORAL | Status: DC | PRN

## 2022-02-10 MED ORDER — FAMOTIDINE 20 MG PO TABS
20.0000 mg | ORAL_TABLET | Freq: Two times a day (BID) | ORAL | Status: DC
  Administered 2022-02-10 – 2022-02-12 (×4): 20 mg via ORAL
  Filled 2022-02-10 (×4): qty 1

## 2022-02-10 MED ORDER — POLYETHYLENE GLYCOL 3350 17 G PO PACK: 17.0000 g | PACK | Freq: Every day | ORAL | Status: DC | PRN

## 2022-02-10 NOTE — Progress Notes (Signed)
Neurology Progress Note   Subjective: Awakens easily, no further seizures Head CT done    Exam: Vitals:   02/10/22 0830 02/10/22 0900  BP: 114/73 (!) 101/47  Pulse: 64 69  Resp: 18 18  Temp:    SpO2: 100% 100%   Gen: In bed, intubated No meningismus  Neuro: MS: Opens eyes to mild stimulation, follows some commands CN: Pupils equal round and reactive, extraocular movements intact Motor: Moves all extremities spontaneously and to command Sensory: Response to mild noxious stimulation in all four extremities  Pertinent Labs: UDS positive for methamphetamine WBC- 11.5 Lactic- 1.4   Imaging Reviewed: Head CT- No acute abnormality, personally reviewed by attending MD EEG 02/08/2022 2303 to 02/09/2022 0445 - This study was within normal limits. No seizures or epileptiform discharges were seen throughout the recording.   Impression: 61 year old male who presented with altered mental status and seizures.  He has a strong history of methamphetamine use including going on "binges."  I suspect that drug intoxication is the likely etiology of his altered mental status/seizures.  He has been afebrile, and his mild leukocytosis is nonspecific.  He has no meningismus.  CT head shows no acute abnormality, confirmed on repeat head CT which confirms MRI findings that were artifactual in the setting of intubation.  Nevertheless will need seizure precautions reviewed after he has been extubated and able to participate in conversation, also recommend methamphetamine discontinuation counseling  Recommendations  - given provoked seizure, will hold off on scheduled antiepileptics - Please review seizure precautions with patient and included in discharge instructions - Please counsel patient on importance of abstinence from methamphetamines - Neurology will sign off, but please do not hesitate to reach out if any questions or concerns arise  Standard seizure precautions: Per [facility]  statutes, patients with seizures are not allowed to drive until  they have been seizure-free for six months. Use caution when using heavy equipment or power tools. Avoid working on ladders or at heights. Take showers instead of baths. Ensure the water temperature is not too high on the home water heater. Do not go swimming alone. When caring for infants or small children, sit down when holding, feeding, or changing them to minimize risk of injury to the child in the event you have a seizure.  To reduce risk of seizures, maintain good sleep hygiene avoid alcohol and illicit drug use, take all anti-seizure medications as prescribed.    Patient seen by Armida Sans, NP, and independently by myself, note compiled with assistance of nurse practitioner   Attending Neurologist's note:  I personally saw this patient, gathering history, performing a neurologic examination, reviewing relevant labs, personally reviewing relevant imaging including head CT, and formulated the assessment and plan, adding the note above for completeness and clarity to accurately reflect my thoughts

## 2022-02-10 NOTE — Progress Notes (Signed)
Lower extremity venous bilateral study completed.   Please see CV Proc for preliminary results.   Vinal Rosengrant, RDMS, RVT  

## 2022-02-10 NOTE — Progress Notes (Signed)
NAME:  Justin Tanner, MRN:  [id number], DOB:  Nov 06, 1960, LOS: 2 ADMISSION DATE:  02/07/2022, CONSULTATION DATE:  02/07/2022 REFERRING MD:  Darl Householder , CHIEF COMPLAINT: Altered mental status  History of Present Illness:  Patient is encephalopathic and/or intubated. Therefore history has been obtained from chart review and   Justin Tanner, is a 61 y.o. male, who presented to the [facility] ED with a chief complaint of altered mental status  They have a pertinent past medical history of polysubstance abuse, CAD, HLD, HTN, Afib  Per sister patient has been increasingly reclusive at home.  Lost 60 to 70 pounds over the past few months.  Reportedly "slept the whole month of October."  Reportedly been using meth with increasing frequency.  On 11/24 patient walks to sisters house and presented with aphasia, which progressed to the seizure-like activity.  EMS was called.  ED course was notable for activation of a code stroke.  CT head negative for LVO or acute changes. EEG was started.  UDS positive for amphetamines, THC.  Patient was found to be in A-fib.  Cardioversion was attempted with conversion to sinus rhythm but conversion back to A-fib.  He was intubated post cardioversion. Keppra load given. Afebrile. No documented hypotension. WBC 19.1. Empiric meningitis coverage started by neuro.  PCCM was consulted for admission.   Pertinent  Medical History  CAD, HLD, HTN, Afib  Significant Hospital Events: Including procedures, antibiotic start and stop dates in addition to other pertinent events   11/24 Admit. PCCM consult. EEG>, CTH neg>, Intubated>, Vanc/Ceftriaxone> Dilt 5mg , prop, 50, fent 50, versed 3 > Got keppr ain ED  11/25 -currently remains on ventilator FiO2 30%.  He is on propofol infusion Fentanyl infusion. Jsut off Versed infusion. Now in Sinus:  and Cardizem drip off.  On thiamine. -   Has low-grade fever.   White count 11.3 K.  Culture negative so far.  On antibiotics ceftriaxone and  vancomycin.  On c-EEG. No anti-epileptics currently. UDS+ for THC, amphetatmines, benzos. Followed commands for RN RVP negative Tracheal aspirate: Polymicrobial Gram stain positive MRSA PCR negative 11/26-  Overnight hypoglycemic and D50 given.  EEG yesterday showed no seizures.  He has temperature to 101 Fahrenheit with white count of 11.5 K.  Lactic acid up to 2 . Tracheal aspirate growing gram-positive cocci and rare gram-negative rods. Remain on Vent - diprivan gtt- > able to follow command but gets agitated.  11/27 - self-extubated. Doing well on 3L [place]. Continues to have low-grade fevers and leukocytosis, unclear etiology. On empiric rocephin. No further seizure episodes, neuro signed off. No need for AED as this was likely provoked seizure.  Interim History / Subjective:   Febrile up to 100.45F overnight. His leukocytosis is unchanged from yesterday. Kidney function gradually improving. Blood cx no growth in past 2 days. EEG without any seizure activity noted, neurology has signed off.   Of note, patient self-extubated this AM. He is in no respiratory distress and able to state name. On 3L [place], saturating well, no stridor noted.   Objective   Blood pressure 104/67, pulse 74, temperature 99.6 F (37.6 C), temperature source Oral, resp. rate 18, height 6' (1.829 m), weight 93.8 kg, SpO2 99 %.    Vent Mode: PSV;CPAP FiO2 (%):  [30 %] 30 % Set Rate:  [18 bmp] 18 bmp Vt Set:  [620 mL] 620 mL PEEP:  [5 cmH20] 5 cmH20 Pressure Support:  [5 cmH20] 5 cmH20 Plateau Pressure:  [16 cmH20] 16 cmH20  Intake/Output Summary (Last 24 hours) at 02/10/2022 1346 Last data filed at 02/10/2022 1200 Gross per 24 hour  Intake 5608.31 ml  Output 1435 ml  Net 4173.31 ml   Filed Weights   02/07/22 2111 02/08/22 0229 02/10/22 0403  Weight: 104 kg 85.7 kg 93.8 kg    General Appearance:  chronically ill-appearing middle aged male, NAD. HEENT: [place]/AT, PERRL, EOMI, able to track across room.  CV: normal  rate and regular rhythm, no murmurs noted. Pulm: Slightly coarse lung sounds bilaterally but no adventitious sounds noted. On 3L [place], saturating well. Abdomen:  soft, nondistended, nontender. Extremities: no peripheral edema noted. Skin: intact in exposed areas, warm and dry. Neuro: AAOx3, moving all extremities, no focal deficits noted.    Resolved Hospital Problem list     Assessment & Plan:  Seizure, suspect secondary to methamphetamine toxicity Acute agitated metabolic encephalopathy secondary to above Polysubstance abuse UDS positive for amphetamines. No additional seizure activity noted on EEG. Per neurology, likely a provoked seizure in the setting of substance use. Low suspicion for meningitis. No acute abnormalities on imaging. Will hold off on AED given provoked seizure. Will need to review seizure precautions and counsel patient on abstinence from methamphetamine use. -appreciate neurology recommendations -off of propofol and fentanyl gtt -monitor off AED -review seizure precautions at discharge -counsel patient on abstinence from methamphetamine use.  SIRS with likely sepsis (fever, wbc)  - Present on Admit Initially started on empiric meningitis coverage but this was stopped due to low suspicion. He again developed a low-grade fever overnight and leukocytosis unchanged from yesterday. Urine culture with no growth. Blood cultures no growth for past 2 days. Trach aspirate culture growing moderate GPC in pairs along with rare GNRs and GPRs. Urine strep negative, urine legionella pending, RVP negative. Lactate has cleared.  -f/u urine legionella -continue empiric rocephin (day 2)  Chronic HF with recovered EF ECHO this admission with LVEF 55-60%, moderately reduced RV systolic function. D-dimer mildly elevated, checking venous dopplers to rule out DVT. His oxygenation is much improved now and suspicion for hypoxia from a PE is much lower on the differential. More likely from  substance ingestion.  -f/u venous dopplers to r/o DVT  HTN HLD CAD Atrial fibrillation upon admission -status post cardioversion in the ER Per chart review looks like patient had AC stopped in 2019 due to CHA2DS2-VASc of 1.  Suspect A-fib worsened due to methamphetamine use. S/p cardioversion in ER, has remained in sinus rhythm since.  - monitor telemetry - Hold home antihypertensives - Continue baby aspirin  Acute respiratory failure secondary to acute metabolic encephalopathy -  Patient self-extubated earlier this morning. He has been transitioned to 3L [place] and is maintaining good O2 saturations. No rhonchi or stridor noted and he is not in any respiratory distress. Doing well from a pulmonary standpoint now. -maintain O2 saturations >90% -wean O2 as tolerated to room air  AKI  Likely prerenal. Unclear what patient's true baseline kidney function is. Last creatinine PTA is from 2018. On admission, Cr 1.72. Since that time, Cr has fluctuated between 1.27-1.5. There may be a component of CKD, but will need to see where kidney function stabilizes. Does have some mild myoglobinuria on UA but normal CK. No UTI found. -avoid nephrotoxic medications as able -strict I/Os -continue foley -goal MAP >65 to ensure adequate perfusion  Hyperglycemia CBGs at goal overnight -Blood Glucose goal 140-180. -SSI  Transaminitis Bilirubinemia -Supportive care as above -Trend  Anemia - onset 02/07/22 No active bleeding  noted. Unclear etiology. - PRBC for hgb </= 6.9gm%   - exceptions are  -  if ACS susepcted/confirmed then transfuse for hgb </= 8.0gm%,  or  -  active bleeding with hemodynamic instability, then transfuse regardless of hemoglobin value  - At at all times try to transfuse 1 unit prbc as possible with exception of active hemorrhage  Suspect malnutrition, unspecified- at risk refeeding syndeome Failure to thrive Unintentional weight loss Depression Substance abuse Wife died in  July. 60 to 70 pounds weight loss over the past few months.  Increased substance use since that time.  -RD consult -Monitor for refeeding    Best Practice (right click and "Reselect all SmartList Selections" daily)   Diet/type: NPO -continue tube feeds DVT prophylaxis: prophylactic heparin  GI prophylaxis: N/A Lines: N/A Foley:  Yes, and it is still needed Code Status:  full code Last date of multidisciplinary goals of care discussion  - [discussed with sister on 11/24.  Unsure if patient wishes at this time continue full scope of care.] -11/25 - Call Sister Hart RochesterRenee Hyatt [phone] - updated. Described him as a stubborn strong willed person - 11/26 - sister updated    Merrilyn PumaJinwala, Ayvin Lipinski, MD 02/10/2022, 1:46 PM   LABS    PULMONARY Recent Labs  Lab 02/07/22 2121 02/07/22 2226 02/08/22 0005  PHART  --  7.445  --   PCO2ART  --  35.4  --   PO2ART  --  546*  --   HCO3  --  24.5  --   TCO2 13* 26  --   O2SAT  --  100 89.1    CBC Recent Labs  Lab 02/08/22 0415 02/08/22 1147 02/10/22 0403  HGB 12.3* 11.4* 10.8*  HCT 36.6* 33.8* 31.3*  WBC 11.3* 11.5* 11.5*  PLT 364 326 226    COAGULATION Recent Labs  Lab 02/07/22 2111  INR 1.2    CARDIAC  No results for input(s): "TROPONINI" in the last 168 hours. No results for input(s): "PROBNP" in the last 168 hours.   CHEMISTRY Recent Labs  Lab 02/07/22 2111 02/07/22 2121 02/07/22 2226 02/08/22 0005 02/08/22 0415 02/09/22 0649 02/09/22 1823 02/10/22 0403  NA 140 139 137  --  140 136 140 135  K 3.5 3.1* 3.1*  --  3.1* 3.4* 3.9 3.5  CL 104 108  --   --  105 105 105 106  CO2 11*  --   --   --  21* 16* 21* 18*  GLUCOSE 249* 246*  --   --  77 100* 118* 146*  BUN 17 18  --   --  16 12 8  6*  CREATININE 1.72* 1.40*  --  1.34* 1.27* 1.50* 1.43* 1.29*  CALCIUM 9.4  --   --   --  9.3 8.0* 8.4* 7.7*  MG  --   --   --   --  2.3 1.9  --  1.8  PHOS  --   --   --   --  2.3* 4.0  --  4.7*   Estimated Creatinine Clearance:  71.5 mL/min (A) (by C-G formula based on SCr of 1.29 mg/dL (H)).   LIVER Recent Labs  Lab 02/07/22 2111 02/09/22 0649 02/10/22 0403  AST 55* 22 32  ALT 46* 24 31  ALKPHOS 89 70 90  BILITOT 2.3* 1.9* 3.1*  PROT 7.1 5.7* 5.2*  ALBUMIN 3.8 2.6* 2.2*  INR 1.2  --   --      INFECTIOUS Recent Labs  Lab 02/09/22 0649 02/09/22 1823 02/10/22 0403  LATICACIDVEN 2.0* 2.3* 1.4  PROCALCITON  --   --  0.17     ENDOCRINE CBG (last 3)  Recent Labs    02/10/22 0333 02/10/22 0738 02/10/22 1133  GLUCAP 148* 141* 71         IMAGING x48h  - image(s) personally visualized  -   highlighted in bold CT HEAD WO CONTRAST (5MM)  Result Date: 02/09/2022 CLINICAL DATA:  New onset seizure activity EXAM: CT HEAD WITHOUT CONTRAST TECHNIQUE: Contiguous axial images were obtained from the base of the skull through the vertex without intravenous contrast. RADIATION DOSE REDUCTION: This exam was performed according to the departmental dose-optimization program which includes automated exposure control, adjustment of the mA and/or kV according to patient size and/or use of iterative reconstruction technique. COMPARISON:  02/07/2022 FINDINGS: Brain: No evidence of acute infarction, hemorrhage, hydrocephalus, extra-axial collection or mass lesion/mass effect. Vascular: No hyperdense vessel or unexpected calcification. Skull: Normal. Negative for fracture or focal lesion. Sinuses/Orbits: Orbits and their contents are within normal limits. Mucosal retention cysts are noted within the maxillary and sphenoid sinuses. Other: None IMPRESSION: No acute intracranial abnormality noted. Electronically Signed   By: Inez Catalina M.D.   On: 02/09/2022 22:02   MR BRAIN WO CONTRAST  Result Date: 02/09/2022 CLINICAL DATA:  Altered mental status EXAM: MRI HEAD WITHOUT CONTRAST TECHNIQUE: Multiplanar, multiecho pulse sequences of the brain and surrounding structures were obtained without intravenous contrast.  COMPARISON:  CT/CTA head and neck 2 days prior. FINDINGS: Brain: There is sulcal FLAIR hyperintensity overlying the bilateral parietal and occipital lobes there is no other evidence of acute intracranial hemorrhage or extra-axial fluid collection. There is no or acute infarct. Parenchymal volume is normal. The ventricles are normal in size. Gray-white differentiation is preserved. Parenchymal signal is essentially normal, with a minimal burden of signal abnormality in the supratentorial white matter. There is no mass lesion.  There is no mass effect or midline shift. Vascular: Normal flow voids. Skull and upper cervical spine: Normal marrow signal. Sinuses/Orbits: There is mucosal thickening throughout the paranasal sinuses. The globes and orbits are unremarkable. Other: None. IMPRESSION: 1. Sulcal FLAIR hyperintensity is favored to be related to intubation/oxygenation. Repeat noncontrast head CT is recommended to exclude the less likely possibility of subarachnoid hemorrhage. 2. Otherwise, no acute intracranial pathology. Electronically Signed   By: Valetta Mole M.D.   On: 02/09/2022 12:44   DG Abd 1 View  Result Date: 02/09/2022 CLINICAL DATA:  Abdominal pain. EXAM: ABDOMEN - 1 VIEW COMPARISON:  CT scan from 2013. FINDINGS: The NG tube is coiled in the fundal region of the stomach. The bowel gas pattern is unremarkable. The bony structures are unremarkable. IMPRESSION: NG tube is coiled in the fundal region of the stomach. Electronically Signed   By: Marijo Sanes M.D.   On: 02/09/2022 11:10

## 2022-02-10 NOTE — Progress Notes (Signed)
RT to called to pt bedside due to patient self extubating. Pt in no distress and able to speak name. Pt placed on 3L nasal cannula. No stridor heard at this time. RT will continue to monitor and be available as needed.

## 2022-02-10 NOTE — Progress Notes (Incomplete)
Attending note: I have seen and examined the patient. History, labs and imaging reviewed.  61 Y/O with substance abuse, CAD, HLD, Afib. Admitted with altered mental status Initially on meningitis coverage but now only on ceftriaxone as suspicion was low  Blood pressure (!) 101/47, pulse 69, temperature 99.6 F (37.6 C), temperature source Oral, resp. rate 18, height 6' (1.829 m), weight 93.8 kg, SpO2 100 %. Gen:      No acute distress HEENT:  EOMI, sclera anicteric Neck:     No masses; no thyromegaly, ETT Lungs:    Clear to auscultation bilaterally; normal respiratory effort*** CV:         Regular rate and rhythm; no murmurs Abd:      + bowel sounds; soft, non-tender; no palpable masses, no distension Ext:    No edema; adequate peripheral perfusion Skin:      Warm and dry; no rash Neuro: Sedated  Labs/Imaging personally reviewed, significant for BUN/Cr 6/1.29 Hb 11.5  Assessment/plan: Acute encephalopathy Seizures due to methamphetamine use MRI with no acute abnormalities. Flair abnormality is non specific. Will await neuro recommendations  AKI Improving  Elevated d dimer LE duplex is pending  Acute resp failure PSV weans today  The patient is critically ill with multiple organ systems failure and requires high complexity decision making for assessment and support, frequent evaluation and titration of therapies, application of advanced monitoring technologies and extensive interpretation of multiple databases.  Critical care time - 35 mins. This represents my time independent of the NPs time taking care of the pt.  Chilton Greathouse MD [facility] 02/10/2022, 9:14 AM

## 2022-02-11 DIAGNOSIS — R569 Unspecified convulsions: Secondary | ICD-10-CM | POA: Diagnosis not present

## 2022-02-11 LAB — GLUCOSE, CAPILLARY
Glucose-Capillary: 102 mg/dL — ABNORMAL HIGH (ref 70–99)
Glucose-Capillary: 123 mg/dL — ABNORMAL HIGH (ref 70–99)
Glucose-Capillary: 146 mg/dL — ABNORMAL HIGH (ref 70–99)
Glucose-Capillary: 160 mg/dL — ABNORMAL HIGH (ref 70–99)
Glucose-Capillary: 67 mg/dL — ABNORMAL LOW (ref 70–99)
Glucose-Capillary: 80 mg/dL (ref 70–99)
Glucose-Capillary: 95 mg/dL (ref 70–99)
Glucose-Capillary: 97 mg/dL (ref 70–99)

## 2022-02-11 LAB — CULTURE, RESPIRATORY W GRAM STAIN: Culture: NORMAL

## 2022-02-11 LAB — PROCALCITONIN: Procalcitonin: 0.27 ng/mL

## 2022-02-11 MED ORDER — DILTIAZEM HCL 25 MG/5ML IV SOLN
5.0000 mg | Freq: Once | INTRAVENOUS | Status: AC
  Administered 2022-02-11: 5 mg via INTRAVENOUS
  Filled 2022-02-11: qty 5

## 2022-02-11 MED ORDER — POTASSIUM CHLORIDE CRYS ER 20 MEQ PO TBCR
40.0000 meq | EXTENDED_RELEASE_TABLET | Freq: Once | ORAL | Status: AC
  Administered 2022-02-11: 40 meq via ORAL
  Filled 2022-02-11: qty 2

## 2022-02-11 MED ORDER — TRAZODONE HCL 50 MG PO TABS
25.0000 mg | ORAL_TABLET | Freq: Every evening | ORAL | Status: DC | PRN
  Administered 2022-02-11: 25 mg via ORAL
  Filled 2022-02-11: qty 1

## 2022-02-11 MED ORDER — BOOST / RESOURCE BREEZE PO LIQD CUSTOM
1.0000 | Freq: Three times a day (TID) | ORAL | Status: DC
  Administered 2022-02-11 – 2022-02-12 (×3): 1 via ORAL

## 2022-02-11 MED ORDER — ALPRAZOLAM 0.25 MG PO TABS
0.2500 mg | ORAL_TABLET | Freq: Once | ORAL | Status: AC
  Administered 2022-02-11: 0.25 mg via ORAL
  Filled 2022-02-11: qty 1

## 2022-02-11 MED ORDER — POTASSIUM CHLORIDE CRYS ER 20 MEQ PO TBCR
40.0000 meq | EXTENDED_RELEASE_TABLET | Freq: Once | ORAL | Status: AC
  Administered 2022-02-12: 40 meq via ORAL
  Filled 2022-02-11: qty 2

## 2022-02-11 MED ORDER — LORAZEPAM 2 MG/ML IJ SOLN
0.5000 mg | Freq: Once | INTRAMUSCULAR | Status: AC
  Administered 2022-02-11: 0.5 mg via INTRAVENOUS
  Filled 2022-02-11: qty 1

## 2022-02-11 MED ORDER — ADULT MULTIVITAMIN W/MINERALS CH
1.0000 | ORAL_TABLET | Freq: Every day | ORAL | Status: DC
  Administered 2022-02-11 – 2022-02-12 (×2): 1 via ORAL
  Filled 2022-02-11 (×2): qty 1

## 2022-02-11 NOTE — Progress Notes (Signed)
Inpatient Diabetes Program Recommendations  AACE/ADA: New Consensus Statement on Inpatient Glycemic Control (2015)  Target Ranges:  Prepandial:   less than 140 mg/dL      Peak postprandial:   less than 180 mg/dL (1-2 hours)      Critically ill patients:  140 - 180 mg/dL   Lab Results  Component Value Date   GLUCAP 102 (H) 02/11/2022   HGBA1C 6.2 (H) 02/08/2022    Review of Glycemic Control  Latest Reference Range & Units 02/10/22 23:39 02/11/22 03:43 02/11/22 04:02 02/11/22 04:37 02/11/22 08:07  Glucose-Capillary 70 - 99 mg/dL 95 67 (L) 80 95 825 (H)   Diabetes history: None  Outpatient Diabetes medications: None Current orders for Inpatient glycemic control:  Novolog moderate q 4 hours  Inpatient Diabetes Program Recommendations:    Does not appear to need Novolog correction since tube feeds are off.  Consider d/c of Novolog correction.   Thanks,  Beryl Meager, RN, BC-ADM Inpatient Diabetes Coordinator Pager [phone]  (8a-5p)

## 2022-02-11 NOTE — Progress Notes (Addendum)
Patient HR converted to Afib 130's to 140's. Patient is lying in bed, denies any discomfort or chest pain. Verbalized he still feels anxious. Ativan given earlier and pt was able to sleep in few hour. Trazodone PRN given.   On call provider made aware of afib with orders made. VS as follows before Cardizem, BP 129/82, HR 129, RR 20 and O2 sat 96 on room air.  At 2339 VS as follows after Cardizem. BP 119/89, HR 110, RR, 18, 100% on room air.

## 2022-02-11 NOTE — Evaluation (Signed)
Occupational Therapy Evaluation Patient Details Name: Justin Tanner MRN: [id number] DOB: November 06, 1960 Today's Date: 02/11/2022   History of Present Illness Justin Tanner, is a 61 y.o. male, admitted 02/07/22 with AMS, aphasia. MRI and CT head negative for LVO or acute changes. UDS positive for amphetamines, THC.  Patient was found to be in A-fib.  Cardioversion was attempted with conversion to sinus rhythm but conversion back to A-fib.  Intubated 11/24-27/23. Per neurology, likely a provoked seizure in the setting of substance use. PMH includes: polysubstance abuse, CAD, HLD, HTN, Afib   Clinical Impression   Pt from home where he was independent in ADL and mobility. Admits to depression after wife died 3 months ago and significant weight loss (60-70 lbs). Not working, has a dog "sweet pea" that he loves. Pt presents with cognitive deficits in awareness, safety, problem solving. Min to mod A for LB ADL, had been walking without DME - now min guard A with increased time and cues for safety/sequencing with RW. Mn guard/supervision for standing grooming at sink and toilet transfers, max A for rear peri care in standing. Activity sent Pt into Afib/RVR and HR at 160. Down to 120-130 seated in recliner. At this time Pt is at increased risk of falls and re-admission if he goes home alone. At this time recommending SNF post-acute to maximize safety and independence in ADL and functional transfers. OT will continue to follow acutely.       Recommendations for follow up therapy are one component of a multi-disciplinary discharge planning process, led by the attending physician.  Recommendations may be updated based on patient status, additional functional criteria and insurance authorization.   Follow Up Recommendations  Skilled nursing-short term rehab (<3 hours/day)     Assistance Recommended at Discharge Frequent or constant Supervision/Assistance  Patient can return home with the following A  little help with walking and/or transfers;A little help with bathing/dressing/bathroom;Assistance with cooking/housework;Direct supervision/assist for medications management;Direct supervision/assist for financial management;Assist for transportation;Help with stairs or ramp for entrance    Functional Status Assessment  Patient has had a recent decline in their functional status and demonstrates the ability to make significant improvements in function in a reasonable and predictable amount of time.  Equipment Recommendations  BSC/3in1    Recommendations for Other Services PT consult;Speech consult (cognition)     Precautions / Restrictions Precautions Precautions: Fall Precaution Comments: watch HR Restrictions Weight Bearing Restrictions: No      Mobility Bed Mobility Overal bed mobility: Needs Assistance Bed Mobility: Supine to Sit     Supine to sit: Min assist     General bed mobility comments: trunk elevation, cues for sequencing    Transfers Overall transfer level: Needs assistance Equipment used: Rolling walker (2 wheels) Transfers: Sit to/from Stand Sit to Stand: Min assist           General transfer comment: vc for safe hand placement, Pt with increased time and attempt x3 to reach feet with OT stabilizing RW      Balance Overall balance assessment: Needs assistance Sitting-balance support: Feet supported, No upper extremity supported Sitting balance-Leahy Scale: Fair     Standing balance support: Single extremity supported, No upper extremity supported, During functional activity, Reliant on assistive device for balance Standing balance-Leahy Scale: Poor Standing balance comment: dynamic balance needs RW                           ADL either performed or  assessed with clinical judgement   ADL Overall ADL's : Needs assistance/impaired Eating/Feeding: Minimal assistance;Sitting   Grooming: Oral care;Wash/dry face;Wash/dry hands;Brushing  hair;Supervision/safety;Standing Grooming Details (indicate cue type and reason): sink level Upper Body Bathing: Min guard;Standing   Lower Body Bathing: Min guard;Sitting/lateral leans   Upper Body Dressing : Set up;Sitting   Lower Body Dressing: Min guard;Sitting/lateral leans   Toilet Transfer: Min guard;Ambulation;Rolling walker (2 wheels) Toilet Transfer Details (indicate cue type and reason): increased time, shuffling gait, vc for safety with RW Toileting- Clothing Manipulation and Hygiene: Maximal assistance;Sit to/from stand Toileting - Clothing Manipulation Details (indicate cue type and reason): Pt able to maintain standing, OT performing rear peri care     Functional mobility during ADLs: Min guard;Rolling walker (2 wheels);Cueing for safety;Cueing for sequencing General ADL Comments: decreased balance, activity tolerance, cues for attention, redirection to task     Vision Baseline Vision/History: 1 Wears glasses Ability to See in Adequate Light: 0 Adequate Patient Visual Report: No change from baseline Vision Assessment?: No apparent visual deficits     Perception     Praxis Praxis Praxis tested?: Within functional limits    Pertinent Vitals/Pain Pain Assessment Pain Assessment: Faces Faces Pain Scale: No hurt Pain Intervention(s): Monitored during session, Repositioned     Hand Dominance Right   Extremity/Trunk Assessment Upper Extremity Assessment Upper Extremity Assessment: Generalized weakness   Lower Extremity Assessment Lower Extremity Assessment: Defer to PT evaluation   Cervical / Trunk Assessment Cervical / Trunk Assessment: Kyphotic   Communication Communication Communication: No difficulties   Cognition Arousal/Alertness: Awake/alert Behavior During Therapy: WFL for tasks assessed/performed Overall Cognitive Status: Impaired/Different from baseline Area of Impairment: Attention, Memory, Safety/judgement, Awareness, Problem solving                    Current Attention Level: Sustained Memory: Decreased short-term memory   Safety/Judgement: Decreased awareness of safety, Decreased awareness of deficits Awareness: Emergent Problem Solving: Slow processing, Decreased initiation, Difficulty sequencing, Requires verbal cues General Comments: Pt very pleasant and cooperative, tangential, needed to lament about his wife that passed away, max redirection throughout entire session. decreased safety awareness, cues for using RW     General Comments  HR up to 160 with ambulation and standing sink activity, SpO2 >95% throughout session on RA    Exercises     Shoulder Instructions      Home Living Family/patient expects to be discharged to:: Private residence Living Arrangements: Alone (sister lives next door, has dog "sweet pea") Available Help at Discharge: Family;Available PRN/intermittently Type of Home: Mobile home Home Access: Ramped entrance     Home Layout: One level     Bathroom Shower/Tub: Chief Strategy Officer: Standard     Home Equipment: Agricultural consultant (2 wheels)          Prior Functioning/Environment Prior Level of Function : Independent/Modified Independent             Mobility Comments: no DME          OT Problem List: Decreased activity tolerance;Decreased strength;Impaired balance (sitting and/or standing);Decreased cognition;Decreased safety awareness;Decreased knowledge of use of DME or AE;Cardiopulmonary status limiting activity      OT Treatment/Interventions: Self-care/ADL training;DME and/or AE instruction;Therapeutic activities;Patient/family education;Balance training    OT Goals(Current goals can be found in the care plan section) Acute Rehab OT Goals Patient Stated Goal: get back to independent, get back to sweet pea (his dog) OT Goal Formulation: With patient Time For Goal Achievement: 02/25/22  Potential to Achieve Goals: Good ADL Goals Pt Will Perform  Grooming: with modified independence;standing Pt Will Perform Upper Body Dressing: with modified independence;sitting Pt Will Perform Lower Body Dressing: with modified independence;sit to/from stand Pt Will Transfer to Toilet: ambulating;with modified independence Pt Will Perform Toileting - Clothing Manipulation and hygiene: with modified independence;sit to/from stand Additional ADL Goal #1: Pt will verbalize at least 3 ways of conserving energy during ADL with no cues  OT Frequency: Min 2X/week    Co-evaluation              AM-PAC OT "6 Clicks" Daily Activity     Outcome Measure Help from another person eating meals?: None Help from another person taking care of personal grooming?: A Little Help from another person toileting, which includes using toliet, bedpan, or urinal?: A Lot Help from another person bathing (including washing, rinsing, drying)?: A Little Help from another person to put on and taking off regular upper body clothing?: A Little Help from another person to put on and taking off regular lower body clothing?: A Little 6 Click Score: 18   End of Session Equipment Utilized During Treatment: Gait belt;Rolling walker (2 wheels) Nurse Communication: Mobility status  Activity Tolerance: Patient tolerated treatment well;Treatment limited secondary to medical complications (Comment) (HR up to 160) Patient left: in chair;with call bell/phone within reach;with chair alarm set  OT Visit Diagnosis: Unsteadiness on feet (R26.81);Other abnormalities of gait and mobility (R26.89);Muscle weakness (generalized) (M62.81);Other symptoms and signs involving the nervous system (R29.898);Adult, failure to thrive (R62.7)                Time: 1131-1205 OT Time Calculation (min): 34 min Charges:  OT General Charges $OT Visit: 1 Visit OT Evaluation $OT Eval Moderate Complexity: 1 Mod OT Treatments $Self Care/Home Management : 8-22 mins Nyoka Cowden OTR/L Acute Rehabilitation  Services Office: [phone]   Evern Bio [facility] 02/11/2022, 1:14 PM

## 2022-02-11 NOTE — Evaluation (Signed)
Physical Therapy Evaluation Patient Details Name: Justin Tanner MRN: [id number] DOB: 1961/02/03 Today's Date: 02/11/2022  History of Present Illness  Justin Tanner, is a 61 y.o. male, admitted 02/07/22 with AMS, aphasia. MRI and CT head negative for LVO or acute changes. UDS positive for amphetamines, THC.  Patient was found to be in A-fib.  Cardioversion was attempted with conversion to sinus rhythm but conversion back to A-fib.  Intubated 11/24-27. Per neurology, likely a provoked seizure in the setting of substance use. PMH includes: polysubstance abuse, CAD, HLD, HTN, Afib  Clinical Impression  Pt admitted with above diagnosis. At baseline, pt lives alone, is independent, and could ambulate in community.  Reports occasionally using RW or furniture surfing.  Today, pt very pleasant and cooperative but needing mod cues at times for sequencing and safety.  He needed min A for transfers and ambulated 10' with min A for balance.  Pt with very slow gait speed indicative of fall risk. Due to lives alone, decreased balance, mobility, and cognition - recommend SNF at d/c at this time. Pt currently with functional limitations due to the deficits listed below (see PT Problem List). Pt will benefit from skilled PT to increase their independence and safety with mobility to allow discharge to the venue listed below.          Recommendations for follow up therapy are one component of a multi-disciplinary discharge planning process, led by the attending physician.  Recommendations may be updated based on patient status, additional functional criteria and insurance authorization.  Follow Up Recommendations Skilled nursing-short term rehab (<3 hours/day) Can patient physically be transported by private vehicle: Yes    Assistance Recommended at Discharge Frequent or constant Supervision/Assistance  Patient can return home with the following  A little help with walking and/or transfers;A little help  with bathing/dressing/bathroom;Assistance with cooking/housework;Help with stairs or ramp for entrance;Direct supervision/assist for financial management;Direct supervision/assist for medications management    Equipment Recommendations None recommended by PT  Recommendations for Other Services       Functional Status Assessment Patient has had a recent decline in their functional status and demonstrates the ability to make significant improvements in function in a reasonable and predictable amount of time.     Precautions / Restrictions Precautions Precautions: Fall Precaution Comments: watch HR Restrictions Weight Bearing Restrictions: No      Mobility  Bed Mobility Overal bed mobility: Needs Assistance Bed Mobility: Supine to Sit, Sit to Supine     Supine to sit: Min assist Sit to supine: Min assist   General bed mobility comments: trunk elevation, cues for sequencing    Transfers Overall transfer level: Needs assistance Equipment used: Rolling walker (2 wheels) Transfers: Sit to/from Stand Sit to Stand: Min assist           General transfer comment: Performed x 2 with min A to rise; increased time; mod cues for hand placement    Ambulation/Gait Ambulation/Gait assistance: Min assist Gait Distance (Feet): 10 Feet Assistive device: Rolling walker (2 wheels) Gait Pattern/deviations: Step-to pattern, Decreased stride length Gait velocity: decreased Gait velocity interpretation: <1.8 ft/sec, indicate of risk for recurrent falls   General Gait Details: low foot clearance shuffle gait with min A to stabilize; very slow gait pattern ; fatigued easily and elevated HR  Stairs            Wheelchair Mobility    Modified Rankin (Stroke Patients Only)       Balance Overall balance assessment: Needs assistance  Sitting-balance support: Feet supported, No upper extremity supported Sitting balance-Leahy Scale: Fair     Standing balance support: Single  extremity supported, Bilateral upper extremity supported Standing balance-Leahy Scale: Poor Standing balance comment: Requiring at least single UE support with min A to stabilize at times                             Pertinent Vitals/Pain Pain Assessment Pain Assessment: No/denies pain    Home Living Family/patient expects to be discharged to:: Private residence Living Arrangements: Alone (sister lives next door, has dog "sweet pea") Available Help at Discharge: Family;Available PRN/intermittently Type of Home: Mobile home Home Access: Ramped entrance       Home Layout: One level Home Equipment: Agricultural consultantolling Walker (2 wheels);Shower seat Additional Comments: has grab bars but hasn't put them up yet    Prior Function Prior Level of Function : Independent/Modified Independent;Driving             Mobility Comments: Has a RW but doesn't use it most of the time; however, he does "furniture surf"; Could ambulate in community ADLs Comments: Independent adls and iadls     Hand Dominance   Dominant Hand: Right    Extremity/Trunk Assessment   Upper Extremity Assessment Upper Extremity Assessment: Defer to OT evaluation    Lower Extremity Assessment Lower Extremity Assessment: Overall WFL for tasks assessed (ROM WFL; MMT 5/5)    Cervical / Trunk Assessment Cervical / Trunk Assessment: Kyphotic  Communication   Communication: No difficulties  Cognition Arousal/Alertness: Awake/alert Behavior During Therapy: WFL for tasks assessed/performed Overall Cognitive Status: Impaired/Different from baseline Area of Impairment: Attention, Memory, Safety/judgement, Awareness, Problem solving                   Current Attention Level: Sustained Memory: Decreased short-term memory   Safety/Judgement: Decreased awareness of safety, Decreased awareness of deficits Awareness: Emergent Problem Solving: Slow processing, Decreased initiation, Difficulty sequencing, Requires  verbal cues General Comments: Pt very pleasant and cooperative, tangential; max redirection throughout entire session. decreased safety awareness, cues for using RW        General Comments General comments (skin integrity, edema, etc.): Pt's HR 100-110 bpm rest up to 130-140 bpm activity and occasional peak to 150 bpm.  Back to 100-110 rest.  Telemetry notified.  O2 sats stable on RA    Exercises     Assessment/Plan    PT Assessment Patient needs continued PT services  PT Problem List Decreased strength;Decreased coordination;Cardiopulmonary status limiting activity;Decreased range of motion;Decreased cognition;Decreased activity tolerance;Decreased knowledge of use of DME;Decreased balance;Decreased safety awareness;Decreased mobility       PT Treatment Interventions DME instruction;Therapeutic exercise;Gait training;Balance training;Stair training;Functional mobility training;Therapeutic activities;Patient/family education;Cognitive remediation    PT Goals (Current goals can be found in the Care Plan section)  Acute Rehab PT Goals Patient Stated Goal: get some rest PT Goal Formulation: With patient Time For Goal Achievement: 02/24/22 Potential to Achieve Goals: Good    Frequency Min 2X/week     Co-evaluation               AM-PAC PT "6 Clicks" Mobility  Outcome Measure Help needed turning from your back to your side while in a flat bed without using bedrails?: None Help needed moving from lying on your back to sitting on the side of a flat bed without using bedrails?: A Little Help needed moving to and from a bed to a chair (including a  wheelchair)?: A Little Help needed standing up from a chair using your arms (e.g., wheelchair or bedside chair)?: A Lot (cues) Help needed to walk in hospital room?: Total (limited distance) Help needed climbing 3-5 steps with a railing? : Total 6 Click Score: 14    End of Session Equipment Utilized During Treatment: Gait  belt Activity Tolerance: Other (comment) (easily fatigued and elevated HR) Patient left: in bed;with call bell/phone within reach;with bed alarm set Nurse Communication: Mobility status PT Visit Diagnosis: Unsteadiness on feet (R26.81);Other abnormalities of gait and mobility (R26.89)    Time: 1583-0940 PT Time Calculation (min) (ACUTE ONLY): 20 min   Charges:   PT Evaluation $PT Eval Low Complexity: 1 Low          Kimberley Dastrup, PT Acute Rehab [facility] [phone]   Rayetta Humphrey 02/11/2022, 2:42 PM

## 2022-02-11 NOTE — Progress Notes (Addendum)
Nutrition Follow-up  DOCUMENTATION CODES:  Non-severe (moderate) malnutrition in context of social or environmental circumstances  INTERVENTION:  -Advance diet as clinically appropriate -Provide Boost Breeze TID while on clear liquid diet (250kcal, 9g protein) -Start daily MVI  NUTRITION DIAGNOSIS:  Moderate Malnutrition related to social / environmental circumstances as evidenced by energy intake < or equal to 50% for > or equal to 1 month, percent weight loss, mild fat depletion, mild muscle depletion.  GOAL:  Patient will meet greater than or equal to 90% of their needs  MONITOR:  PO intake, Supplement acceptance, Diet advancement  REASON FOR ASSESSMENT:  Ventilator, Consult Assessment of nutrition requirement/status  ASSESSMENT:  61 year old male who presented to the ED on 11/24 with AMS and seizures. PMH of CAD, HTN, HLD, prior cardiac arrest in 2014 during lumbar spine surgery, atrial fibrillation, polysubstance abuse. Pt intubated in the ED. Pt admitted with acute toxic/metabolic encephalopathy from methamphetamine overdose, acidosis, and AKI.  Visited pt at bedside this afternoon. He reports he does not feel ready to try solid foods yet because he was given a bowel regimen and knows he's about to have a large BM. Plan to reassess in AM. He is agreeable to try Boost Breeze (250kcal, 9g protein) to optimize intake with restrictive diet.   Pt reports very poor po intake since April when his wife unexpected passed away. He reports eating the bare minimum to stay alive to take care of his dog. Reports not eating every day. Pt meeting <50% estimated needs for 7 months. In this time, pt had a significant 23% unintended weight loss. NFPE shows mild muscle wasting and mild fat loss. Pt meets ASPEN criteria for moderate protein calorie malnutrition r/t social/behavioral circumstances.  11/24: intubated 11/25: EN initiated 11/27: self extubated, OGT removed. Diet advanced to clear  liquids  Medications reviewed and include: pepcid, thiamine, rocephine  Labs reviewed: Na:135, K:3.5, BG:71-156, BUN:6, Cr:1.29, Phos:4.7, Total Bili:3.1   NUTRITION - FOCUSED PHYSICAL EXAM:  Flowsheet Row Most Recent Value  Orbital Region Mild depletion  Upper Arm Region Moderate depletion  Thoracic and Lumbar Region Mild depletion  Buccal Region No depletion  Temple Region Mild depletion  Clavicle Bone Region Mild depletion  Clavicle and Acromion Bone Region Mild depletion  Scapular Bone Region Unable to assess  Dorsal Hand No depletion  Patellar Region Mild depletion  Anterior Thigh Region No depletion  Posterior Calf Region Mild depletion  Edema (RD Assessment) Mild  Hair Reviewed  Eyes Reviewed  Mouth Reviewed  Skin Reviewed  Nails Reviewed       Diet Order:   Diet Order             Diet clear liquid Room service appropriate? Yes; Fluid consistency: Thin  Diet effective now                   EDUCATION NEEDS:  Education needs have been addressed  Skin:  Skin Assessment: Skin Integrity Issues: Skin Integrity Issues:: Stage II Stage II: L buttocks  Last BM:  11/27  Height:  Ht Readings from Last 1 Encounters:  02/07/22 6' (1.829 m)    Weight:  Wt Readings from Last 1 Encounters:  02/11/22 89.8 kg    BMI:  Body mass index is 26.85 kg/m.  Estimated Nutritional Needs:  Kcal:  2000-2200 Protein:  110-130 grams Fluid:  >2.0 L  Antony Salmon, MS, RD, LDN, CNSC See AMiON for contact information

## 2022-02-11 NOTE — Progress Notes (Signed)
PROGRESS NOTE    Justin Tanner  [id number] DOB: January 01, 1961 DOA: 02/07/2022 PCP: Patient, No Pcp Per   Brief Narrative:  Justin Tanner, is a 61 y.o. male, past medical history of polysubstance abuse, CAD, HLD, HTN, Afib who presented to the [facility] ED with a chief complaint of altered mental status, code stroke was activated, CT head negative.  EEG negative.  UDS positive for amphetamines and THC.  Patient was found to be in A-fib.  Cardioversion was attempted and he converted to sinus rhythm but soon converted back to A-fib.  He was intubated post cardioversion, Keppra was loaded.  Admitted under ICU.  Self extubated on 02/10/2022 and was doing well on 3 L oxygen and transferred to [facility] on 02/11/2022.  Also he was started on empiric treatment for meningitis but due to low suspicion, those antibiotics were discontinued but then he had low-grade fever and respiratory aspirate was growing gram-positive cocci so he was resumed on Rocephin.  Significant Hospital Events: Including procedures, antibiotic start and stop dates in addition to other pertinent events   11/24 Admit. PCCM consult. EEG>, CTH neg>, Intubated>, Vanc/Ceftriaxone> Dilt 5mg , prop, 50, fent 50, versed 3 > Got keppr ain ED   11/25 -currently remains on ventilator FiO2 30%.  He is on propofol infusion Fentanyl infusion. Jsut off Versed infusion. Now in Sinus:  and Cardizem drip off.  On thiamine. -   Has low-grade fever.   White count 11.3 K.  Culture negative so far.  On antibiotics ceftriaxone and vancomycin.  On c-EEG. No anti-epileptics currently. UDS+ for THC, amphetatmines, benzos. Followed commands for RN RVP negative Tracheal aspirate: Polymicrobial Gram stain positive MRSA PCR negative 11/26-  Overnight hypoglycemic and D50 given.  EEG yesterday showed no seizures.  He has temperature to 101 Fahrenheit with white count of 11.5 K.  Lactic acid up to 2 . Tracheal aspirate growing gram-positive cocci and rare gram-negative  rods. Remain on Vent - diprivan gtt- > able to follow command but gets agitated.  11/27 - self-extubated. Doing well on 3L [place]. Continues to have low-grade fevers and leukocytosis, unclear etiology. On empiric rocephin. No further seizure episodes, neuro signed off. No need for AED as this was likely provoked seizure.  Assessment & Plan:   Principal Problem:   Seizure ([place]) Active Problems:   Altered mental status   AKI (acute kidney injury) ([place])   Polysubstance abuse ([place])   Acute respiratory failure (H[facility])  Seizure, suspect secondary to methamphetamine toxicity Acute agitated metabolic encephalopathy secondary to above Polysubstance abuse UDS positive for amphetamines. No additional seizure activity noted on EEG. Per neurology, likely a provoked seizure in the setting of substance use. Low suspicion for meningitis. No acute abnormalities on imaging. Will hold off on AED given provoked seizure. Will need to review seizure precautions and counsel patient on abstinence from methamphetamine use. -appreciate neurology recommendations -off of propofol and fentanyl gtt -monitor off AED -review seizure precautions at discharge -counsel patient on abstinence from methamphetamine use.  Neurology signed off on 02/10/2022.   Acute respiratory failure with hypoxia and sepsis secondary to presumed community-acquired pneumonia/aspiration pneumonia (fever, wbc)  - Present on Admit Initially started on empiric meningitis coverage but this was stopped due to low suspicion. He again developed a low-grade fever. Urine culture with no growth. Blood cultures no growth for past 2 days. Trach aspirate culture growing moderate GPC in pairs along with rare GNRs and GPRs. Urine strep negative, urine legionella pending, RVP negative. Lactate has  cleared.  He was continued on empiric Rocephin.  Patient self extubated on 02/10/2022.  Currently on 1 L oxygen.   Chronic HF with recovered EF ECHO this admission with LVEF  55-60%, moderately reduced RV systolic function. D-dimer mildly elevated, and lower extremity ultrasound negative for DVT. His oxygenation is much improved now and suspicion for hypoxia from a PE is much lower on the differential. More likely from substance ingestion.    HTN HLD CAD Atrial fibrillation upon admission -status post cardioversion in the ER Per chart review looks like patient had AC stopped in 2019 due to CHA2DS2-VASc of 1.  Suspect A-fib worsened due to methamphetamine use. S/p cardioversion in ER, has remained in sinus rhythm since.  - monitor telemetry - Hold home antihypertensives as blood pressure is stable. - Continue baby aspirin   AKI  Likely prerenal. Unclear what patient's true baseline kidney function is. Last creatinine PTA is from 2018. On admission, Cr 1.72. Since that time, creatinine has slowly improved.  Currently 1.29.  Avoid any nephrotoxic agents.   Hyperglycemia/prediabetes CBGs at goal, hemoglobin A1c 6.2, meets criteria for prediabetes.  Transaminitis Bilirubinemia -Supportive care as above -Trend   Anemia - onset 02/07/22 No active bleeding noted. Unclear etiology.  Hemoglobin stable.   Suspect malnutrition, unspecified- at risk refeeding syndeome Failure to thrive/deconditioning. Unintentional weight loss Depression Substance abuse Wife died in 09-24-22. 60 to 70 pounds weight loss over the past few months.  Increased substance use since that time.  -RD consult -Monitor for refeeding.  PT OT consulted.    DVT prophylaxis: heparin injection 5,000 Units Start: 02/08/22 0600 SCDs Start: 02/08/22 0008   Code Status: Full Code  Family Communication:  None present at bedside.  Plan of care discussed with patient in length and he/she verbalized understanding and agreed with it.  Status is: Inpatient Remains inpatient appropriate because: Still very weak, needs to be assessed by PT OT.   Estimated body mass index is 26.85 kg/m as calculated from  the following:   Height as of this encounter: 6' (1.829 m).   Weight as of this encounter: 89.8 kg.  Pressure Injury 02/08/22 Buttocks Left Stage 2 -  Partial thickness loss of dermis presenting as a shallow open injury with a red, pink wound bed without slough. (Active)  02/08/22 0115  Location: Buttocks  Location Orientation: Left  Staging: Stage 2 -  Partial thickness loss of dermis presenting as a shallow open injury with a red, pink wound bed without slough.  Wound Description (Comments):   Present on Admission: Yes  Dressing Type Foam - Lift dressing to assess site every shift 02/10/22 2322   Nutritional Assessment: Body mass index is 26.85 kg/m.Marland Kitchen Seen by dietician.  I agree with the assessment and plan as outlined below: Nutrition Status: Nutrition Problem: Inadequate oral intake Etiology:  (restricted diet) Signs/Symptoms:  (clear liquid diet inadequate to meet estimated needs) Interventions: Refer to RD note for recommendations  . Skin Assessment: I have examined the patient's skin and I agree with the wound assessment as performed by the wound care RN as outlined below: Pressure Injury 02/08/22 Buttocks Left Stage 2 -  Partial thickness loss of dermis presenting as a shallow open injury with a red, pink wound bed without slough. (Active)  02/08/22 0115  Location: Buttocks  Location Orientation: Left  Staging: Stage 2 -  Partial thickness loss of dermis presenting as a shallow open injury with a red, pink wound bed without slough.  Wound Description (Comments):  Present on Admission: Yes  Dressing Type Foam - Lift dressing to assess site every shift 02/10/22 2322    Consultants:  Neurology-signed off  Procedures:  As above  Antimicrobials:  Anti-infectives (From admission, onward)    Start     Dose/Rate Route Frequency Ordered Stop   02/09/22 1145  cefTRIAXone (ROCEPHIN) 2 g in sodium chloride 0.9 % 100 mL IVPB        2 g 200 mL/hr over 30 Minutes Intravenous  Every 24 hours 02/09/22 1059     02/08/22 1200  ampicillin (OMNIPEN) 2 g in sodium chloride 0.9 % 100 mL IVPB  Status:  Discontinued        2 g 300 mL/hr over 20 Minutes Intravenous Every 4 hours 02/08/22 0919 02/08/22 1127   02/08/22 1045  acyclovir (ZOVIRAX) 850 mg in dextrose 5 % 250 mL IVPB  Status:  Discontinued        850 mg 267 mL/hr over 60 Minutes Intravenous Every 8 hours 02/08/22 0945 02/08/22 1127   02/08/22 1000  cefTRIAXone (ROCEPHIN) 2 g in sodium chloride 0.9 % 100 mL IVPB  Status:  Discontinued        2 g 200 mL/hr over 30 Minutes Intravenous Every 12 hours 02/08/22 0529 02/08/22 1127   02/08/22 1000  vancomycin (VANCOCIN) IVPB 1000 mg/200 mL premix  Status:  Discontinued        1,000 mg 200 mL/hr over 60 Minutes Intravenous Every 12 hours 02/08/22 0539 02/08/22 1127   02/07/22 2345  cefTRIAXone (ROCEPHIN) 2 g in sodium chloride 0.9 % 100 mL IVPB        2 g 200 mL/hr over 30 Minutes Intravenous  Once 02/07/22 2342 02/08/22 0816   02/07/22 2345  vancomycin (VANCOCIN) IVPB 1000 mg/200 mL premix        1,000 mg 200 mL/hr over 60 Minutes Intravenous  Once 02/07/22 2342 02/08/22 0130         Subjective: Patient seen and examined.  He has no complaints.  Objective: Vitals:   02/10/22 2336 02/11/22 0334 02/11/22 0500 02/11/22 1124  BP: 136/72 129/78  (!) 140/75  Pulse: 82 78  83  Resp: 16 16  (!) 22  Temp: 97.7 F (36.5 C) 98.9 F (37.2 C)  98 F (36.7 C)  TempSrc: Oral   Oral  SpO2: 92% 99%  100%  Weight:   89.8 kg   Height:        Intake/Output Summary (Last 24 hours) at 02/11/2022 1145 Last data filed at 02/11/2022 1051 Gross per 24 hour  Intake 708.06 ml  Output 3915 ml  Net -3206.94 ml   Filed Weights   02/08/22 0229 02/10/22 0403 02/11/22 0500  Weight: 85.7 kg 93.8 kg 89.8 kg    Examination:  General exam: Appears calm and comfortable  Respiratory system: Clear to auscultation. Respiratory effort normal. Cardiovascular system: S1 & S2 heard,  RRR. No JVD, murmurs, rubs, gallops or clicks. No pedal edema. Gastrointestinal system: Abdomen is nondistended, soft and nontender. No organomegaly or masses felt. Normal bowel sounds heard. Central nervous system: Alert and oriented. No focal neurological deficits. Extremities: Symmetric 5 x 5 power. Skin: No rashes, lesions or ulcers  Data Reviewed: I have personally reviewed following labs and imaging studies  CBC: Recent Labs  Lab 02/07/22 2111 02/07/22 2121 02/07/22 2226 02/08/22 0005 02/08/22 0415 02/08/22 1147 02/10/22 0403  WBC 19.1*  --   --  10.6* 11.3* 11.5* 11.5*  NEUTROABS 12.2*  --   --   --   --   --   --  HGB 13.9   < > 12.9* 12.6* 12.3* 11.4* 10.8*  HCT 45.4   < > 38.0* 37.0* 36.6* 33.8* 31.3*  MCV 92.3  --   --  83.9 82.4 83.3 84.4  PLT 435*  --   --  376 364 326 226   < > = values in this interval not displayed.   Basic Metabolic Panel: Recent Labs  Lab 02/07/22 2111 02/07/22 2121 02/07/22 2226 02/08/22 0005 02/08/22 0415 02/09/22 0649 02/09/22 1823 02/10/22 0403  NA 140 139 137  --  140 136 140 135  K 3.5 3.1* 3.1*  --  3.1* 3.4* 3.9 3.5  CL 104 108  --   --  105 105 105 106  CO2 11*  --   --   --  21* 16* 21* 18*  GLUCOSE 249* 246*  --   --  77 100* 118* 146*  BUN 17 18  --   --  16 12 8  6*  CREATININE 1.72* 1.40*  --  1.34* 1.27* 1.50* 1.43* 1.29*  CALCIUM 9.4  --   --   --  9.3 8.0* 8.4* 7.7*  MG  --   --   --   --  2.3 1.9  --  1.8  PHOS  --   --   --   --  2.3* 4.0  --  4.7*   GFR: Estimated Creatinine Clearance: 66 mL/min (A) (by C-G formula based on SCr of 1.29 mg/dL (H)). Liver Function Tests: Recent Labs  Lab 02/07/22 2111 02/09/22 0649 02/10/22 0403  AST 55* 22 32  ALT 46* 24 31  ALKPHOS 89 70 90  BILITOT 2.3* 1.9* 3.1*  PROT 7.1 5.7* 5.2*  ALBUMIN 3.8 2.6* 2.2*   No results for input(s): "LIPASE", "AMYLASE" in the last 168 hours. Recent Labs  Lab 02/08/22 0005  AMMONIA 11   Coagulation Profile: Recent Labs  Lab  02/07/22 2111  INR 1.2   Cardiac Enzymes: Recent Labs  Lab 02/09/22 1823 02/10/22 0403  CKTOTAL 149 111  CKMB 2.1 1.6   BNP (last 3 results) No results for input(s): "PROBNP" in the last 8760 hours. HbA1C: No results for input(s): "HGBA1C" in the last 72 hours. CBG: Recent Labs  Lab 02/11/22 0343 02/11/22 0402 02/11/22 0437 02/11/22 0807 02/11/22 1126  GLUCAP 67* 80 95 102* 123*   Lipid Profile: No results for input(s): "CHOL", "HDL", "LDLCALC", "TRIG", "CHOLHDL", "LDLDIRECT" in the last 72 hours. Thyroid Function Tests: No results for input(s): "TSH", "T4TOTAL", "FREET4", "T3FREE", "THYROIDAB" in the last 72 hours. Anemia Panel: No results for input(s): "VITAMINB12", "FOLATE", "FERRITIN", "TIBC", "IRON", "RETICCTPCT" in the last 72 hours. Sepsis Labs: Recent Labs  Lab 02/08/22 0415 02/09/22 0649 02/09/22 1823 02/10/22 0403 02/11/22 0518  PROCALCITON  --   --   --  0.17 0.27  LATICACIDVEN 1.4 2.0* 2.3* 1.4  --     Recent Results (from the past 240 hour(s))  Blood culture (routine x 2)     Status: None (Preliminary result)   Collection Time: 02/08/22 12:05 AM   Specimen: BLOOD  Result Value Ref Range Status   Specimen Description BLOOD LEFT ANTECUBITAL  Final   Special Requests   Final    BOTTLES DRAWN AEROBIC AND ANAEROBIC Blood Culture adequate volume   Culture   Final    NO GROWTH 3 DAYS Performed at [facility], [address], [place], [place] [address]    Report Status PENDING  Incomplete  Blood culture (routine  x 2)     Status: None (Preliminary result)   Collection Time: 02/08/22 12:10 AM   Specimen: BLOOD LEFT HAND  Result Value Ref Range Status   Specimen Description BLOOD LEFT HAND  Final   Special Requests   Final    BOTTLES DRAWN AEROBIC AND ANAEROBIC Blood Culture adequate volume   Culture   Final    NO GROWTH 3 DAYS Performed at [facility], [address], [place], [place] [address]    Report Status PENDING  Incomplete   MRSA Next Gen by PCR, Nasal     Status: None   Collection Time: 02/08/22 12:30 AM   Specimen: Nasal Mucosa; Nasal Swab  Result Value Ref Range Status   MRSA by PCR Next Gen NOT DETECTED NOT DETECTED Final    Comment: (NOTE) The GeneXpert MRSA Assay (FDA approved for NASAL specimens only), is one component of a comprehensive MRSA colonization surveillance program. It is not intended to diagnose MRSA infection nor to guide or monitor treatment for MRSA infections. Test performance is not FDA approved in patients less than 78 years old. Performed at [facility], [place] [address] [place], [place] [address]   Culture, Respiratory w Gram Stain     Status: None   Collection Time: 02/08/22  5:08 AM   Specimen: Endotracheal; Respiratory  Result Value Ref Range Status   Specimen Description ENDOTRACHEAL  Final   Special Requests NONE  Final   Gram Stain   Final    RARE WBC PRESENT, PREDOMINANTLY PMN MODERATE GRAM POSITIVE COCCI IN PAIRS RARE GRAM POSITIVE RODS RARE GRAM NEGATIVE RODS    Culture   Final    FEW Normal respiratory flora-no Staph aureus or Pseudomonas seen Performed at [facility], [address] [place], [place] [address]    Report Status 02/11/2022 FINAL  Final  Respiratory (~20 pathogens) panel by PCR     Status: None   Collection Time: 02/08/22 11:47 AM   Specimen: Nasopharyngeal Swab; Respiratory  Result Value Ref Range Status   Adenovirus NOT DETECTED NOT DETECTED Final   Coronavirus 229E NOT DETECTED NOT DETECTED Final    Comment: (NOTE) The Coronavirus on the Respiratory Panel, DOES NOT test for the novel  Coronavirus (2019 nCoV)    Coronavirus HKU1 NOT DETECTED NOT DETECTED Final   Coronavirus NL63 NOT DETECTED NOT DETECTED Final   Coronavirus OC43 NOT DETECTED NOT DETECTED Final   Metapneumovirus NOT DETECTED NOT DETECTED Final   Rhinovirus / Enterovirus NOT DETECTED NOT DETECTED Final   Influenza A NOT DETECTED NOT DETECTED Final    Influenza B NOT DETECTED NOT DETECTED Final   Parainfluenza Virus 1 NOT DETECTED NOT DETECTED Final   Parainfluenza Virus 2 NOT DETECTED NOT DETECTED Final   Parainfluenza Virus 3 NOT DETECTED NOT DETECTED Final   Parainfluenza Virus 4 NOT DETECTED NOT DETECTED Final   Respiratory Syncytial Virus NOT DETECTED NOT DETECTED Final   Bordetella pertussis NOT DETECTED NOT DETECTED Final   Bordetella Parapertussis NOT DETECTED NOT DETECTED Final   Chlamydophila pneumoniae NOT DETECTED NOT DETECTED Final   Mycoplasma pneumoniae NOT DETECTED NOT DETECTED Final    Comment: Performed at [facility], [place]. [address], [place], [place] [address]  Urine Culture     Status: None   Collection Time: 02/09/22 11:19 AM   Specimen: Urine, Clean Catch  Result Value Ref Range Status   Specimen Description URINE, CLEAN CATCH  Final   Special Requests  NONE  Final   Culture   Final    NO GROWTH Performed at [facility], [place] [address] [place], [place] [address]    Report Status 02/10/2022 FINAL  Final     Radiology Studies: VAS [place] LOWER EXTREMITY VENOUS (DVT)  Result Date: 02/10/2022  Lower Venous DVT Study Patient Name:  Justin Tanner [facility]  Date of Exam:   02/10/2022 Medical Rec #: [id number]           Accession #:    [id number] Date of Birth: 08-Apr-1960            Patient Gender: M Patient Age:   5 years Exam Location:  [facility] Procedure:      VAS [place] LOWER EXTREMITY VENOUS (DVT) Referring Phys: MURALI RAMASWAMY --------------------------------------------------------------------------------  Indications: Edema.  Risk Factors: Hx of substance abuse. Comparison Study: No prior studies. Performing Technologist: Darlin Coco RDMS, RVT  Examination Guidelines: A complete evaluation includes B-mode imaging, spectral Doppler, color Doppler, and power Doppler as needed of all accessible portions of each vessel. Bilateral testing is considered an integral part of a complete examination. Limited  examinations for reoccurring indications may be performed as noted. The reflux portion of the exam is performed with the patient in reverse Trendelenburg.  +---------+---------------+---------+-----------+----------+--------------+ RIGHT    CompressibilityPhasicitySpontaneityPropertiesThrombus Aging +---------+---------------+---------+-----------+----------+--------------+ CFV      Full           Yes      Yes                                 +---------+---------------+---------+-----------+----------+--------------+ SFJ      Full                                                        +---------+---------------+---------+-----------+----------+--------------+ FV Prox  Full                                                        +---------+---------------+---------+-----------+----------+--------------+ FV Mid   Full                                                        +---------+---------------+---------+-----------+----------+--------------+ FV DistalFull                                                        +---------+---------------+---------+-----------+----------+--------------+ PFV      Full                                                        +---------+---------------+---------+-----------+----------+--------------+ POP      Full  Yes      Yes                                 +---------+---------------+---------+-----------+----------+--------------+ PTV      Full                                                        +---------+---------------+---------+-----------+----------+--------------+ PERO     Full                                                        +---------+---------------+---------+-----------+----------+--------------+ Gastroc  Full                                                        +---------+---------------+---------+-----------+----------+--------------+    +---------+---------------+---------+-----------+----------+--------------+ LEFT     CompressibilityPhasicitySpontaneityPropertiesThrombus Aging +---------+---------------+---------+-----------+----------+--------------+ CFV      Full           Yes      Yes                                 +---------+---------------+---------+-----------+----------+--------------+ SFJ      Full                                                        +---------+---------------+---------+-----------+----------+--------------+ FV Prox  Full                                                        +---------+---------------+---------+-----------+----------+--------------+ FV Mid   Full                                                        +---------+---------------+---------+-----------+----------+--------------+ FV DistalFull                                                        +---------+---------------+---------+-----------+----------+--------------+ PFV      Full                                                        +---------+---------------+---------+-----------+----------+--------------+ POP  Full           Yes      Yes                                 +---------+---------------+---------+-----------+----------+--------------+ PTV      Full                                                        +---------+---------------+---------+-----------+----------+--------------+ PERO     Full                                                        +---------+---------------+---------+-----------+----------+--------------+ Gastroc  Full                                                        +---------+---------------+---------+-----------+----------+--------------+     Summary: RIGHT: - There is no evidence of deep vein thrombosis in the lower extremity.  - No cystic structure found in the popliteal fossa.  LEFT: - There is no evidence of deep vein thrombosis in  the lower extremity.  - No cystic structure found in the popliteal fossa.  *See table(s) above for measurements and observations. Electronically signed by Harold Barban MD on 02/10/2022 at 9:03:03 PM.    Final    CT HEAD WO CONTRAST (5MM)  Result Date: 02/09/2022 CLINICAL DATA:  New onset seizure activity EXAM: CT HEAD WITHOUT CONTRAST TECHNIQUE: Contiguous axial images were obtained from the base of the skull through the vertex without intravenous contrast. RADIATION DOSE REDUCTION: This exam was performed according to the departmental dose-optimization program which includes automated exposure control, adjustment of the mA and/or kV according to patient size and/or use of iterative reconstruction technique. COMPARISON:  02/07/2022 FINDINGS: Brain: No evidence of acute infarction, hemorrhage, hydrocephalus, extra-axial collection or mass lesion/mass effect. Vascular: No hyperdense vessel or unexpected calcification. Skull: Normal. Negative for fracture or focal lesion. Sinuses/Orbits: Orbits and their contents are within normal limits. Mucosal retention cysts are noted within the maxillary and sphenoid sinuses. Other: None IMPRESSION: No acute intracranial abnormality noted. Electronically Signed   By: Inez Catalina M.D.   On: 02/09/2022 22:02   MR BRAIN WO CONTRAST  Result Date: 02/09/2022 CLINICAL DATA:  Altered mental status EXAM: MRI HEAD WITHOUT CONTRAST TECHNIQUE: Multiplanar, multiecho pulse sequences of the brain and surrounding structures were obtained without intravenous contrast. COMPARISON:  CT/CTA head and neck 2 days prior. FINDINGS: Brain: There is sulcal FLAIR hyperintensity overlying the bilateral parietal and occipital lobes there is no other evidence of acute intracranial hemorrhage or extra-axial fluid collection. There is no or acute infarct. Parenchymal volume is normal. The ventricles are normal in size. Gray-white differentiation is preserved. Parenchymal signal is essentially  normal, with a minimal burden of signal abnormality in the supratentorial white matter. There is no mass lesion.  There is no mass effect or midline shift. Vascular: Normal flow voids. Skull and upper cervical spine: Normal marrow  signal. Sinuses/Orbits: There is mucosal thickening throughout the paranasal sinuses. The globes and orbits are unremarkable. Other: None. IMPRESSION: 1. Sulcal FLAIR hyperintensity is favored to be related to intubation/oxygenation. Repeat noncontrast head CT is recommended to exclude the less likely possibility of subarachnoid hemorrhage. 2. Otherwise, no acute intracranial pathology. Electronically Signed   By: Valetta Mole M.D.   On: 02/09/2022 12:44    Scheduled Meds:  aspirin  81 mg Oral Daily   Chlorhexidine Gluconate Cloth  6 each Topical Daily   famotidine  20 mg Oral BID   heparin  5,000 Units Subcutaneous Q8H   [START ON 02/16/2022] thiamine (VITAMIN B1) injection  100 mg Intravenous Daily   Continuous Infusions:  cefTRIAXone (ROCEPHIN)  IV Stopped (02/10/22 1203)   feeding supplement (VITAL 1.5 CAL) Stopped (02/10/22 0910)   thiamine (VITAMIN B1) injection Stopped (02/10/22 1000)     LOS: 3 days   Darliss Cheney, MD [facility]  02/11/2022, 11:45 AM   *Please note that this is a verbal dictation therefore any spelling or grammatical errors are due to the "[place]" system interpretation.  Please page via [place] and do not message via secure chat for urgent patient care matters. Secure chat can be used for non urgent patient care matters.  How to contact the [facility] Attending or Consulting provider [place] or covering provider during after hours [place], for this patient?  Check the care team in [facility] and look for a) attending/consulting [facility] provider listed and b) the [facility] team listed. Page or secure chat [id number]. Log into [email or web address] and use [place]'s universal password to access. If you do not have the password, please contact the hospital  operator. Locate the [facility] provider you are looking for under [facility] and page to a number that you can be directly reached. If you still have difficulty reaching the provider, please page the [facility] (Director on Call) for the Hospitalists listed on amion for assistance.

## 2022-02-11 NOTE — Progress Notes (Addendum)
Hypoglycemic Event  CBG: 67  Treatment: 4 oz juice/soda  Symptoms: None  Follow-up CBG: Time:0403 CBG Result:80  Possible Reasons for Event: Inadequate meal intake  Comments/MD notified:  Repeat cbg: 1601- 95 E-link RN Darel Hong aware   Nunzio Cory

## 2022-02-12 ENCOUNTER — Other Ambulatory Visit (HOSPITAL_COMMUNITY): Payer: Self-pay

## 2022-02-12 DIAGNOSIS — R569 Unspecified convulsions: Secondary | ICD-10-CM | POA: Diagnosis not present

## 2022-02-12 DIAGNOSIS — E440 Moderate protein-calorie malnutrition: Secondary | ICD-10-CM | POA: Insufficient documentation

## 2022-02-12 LAB — GLUCOSE, CAPILLARY: Glucose-Capillary: 116 mg/dL — ABNORMAL HIGH (ref 70–99)

## 2022-02-12 LAB — MAGNESIUM: Magnesium: 1.9 mg/dL (ref 1.7–2.4)

## 2022-02-12 MED ORDER — CEFDINIR 300 MG PO CAPS
300.0000 mg | ORAL_CAPSULE | Freq: Two times a day (BID) | ORAL | 0 refills | Status: AC
  Filled 2022-02-12: qty 10, 5d supply, fill #0

## 2022-02-12 MED ORDER — MAGNESIUM SULFATE 2 GM/50ML IV SOLN
2.0000 g | Freq: Once | INTRAVENOUS | Status: AC
  Administered 2022-02-12: 2 g via INTRAVENOUS
  Filled 2022-02-12: qty 50

## 2022-02-12 MED ORDER — ASPIRIN 325 MG PO TBEC: 325.0000 mg | DELAYED_RELEASE_TABLET | Freq: Every day | ORAL | Status: AC

## 2022-02-12 NOTE — Discharge Summary (Signed)
Physician Discharge Summary   Patient: Justin Tanner MRN: [id number] DOB: [birth year]  Admit date:     02/07/2022  Discharge date: 02/12/22  Discharge Physician: Alberteen Sam   PCP: Hurshel Party, NP     Recommendations at discharge:  Follow up with PCP Amy Moon in 1 week after substance related seizure Amy Moon: Please confirm patient does not take Xarelto for his Afib Please reassess in 1 week after treatment for pneumonia Re-evaluate sacral pressure wound      Discharge Diagnoses: Principal Problem:   Seizure, suspect secondary to methamphetamine toxicity Active Problems:   Acute metabolic encephalopathy due to seizure   Acute toxic encephalopathy due to methamphetamine   AKI (acute kidney injury) (HCC)   Polysubstance abuse (HCC)   Acute respiratory failure with hypoxia    Sepsis due to community acquired pneumonia   Malnutrition of moderate degree   Hypertension   Paroxysmal atrial fibrillation   Chronic diastolic CHF   Coronary artery disease   Prediabetes   Transaminitis   Anemia   Unintentional weight loss       Hospital Course: Mr. Coots is a 61 y.o. M with hx alcohol and polysubstance abuse, CAD, HTN, and Afib who presented with altered mental status.  UDS positive for amphetamines and THC.  Patient was found to be in A-fib.  Cardioversion was attempted in the ER and he converted to sinus rhythm but soon converted back to A-fib.  He was intubated post cardioversion, Keppra was loaded.  Admitted under ICU.  Self extubated on 02/10/2022       Seizure, suspect secondary to methamphetamine toxicity Acute agitated metabolic encephalopathy secondary to above Polysubstance abuse Concern for some seizure activity on admission.  UDS positive for amphetamines. No additional seizure activity noted on EEG.   Neurology consulted, given likely a provoked seizure in the setting of substance use, no AED recommended at discharge. Very low clinical  suspicion for meningitis.     Acute respiratory failure with hypoxia and sepsis secondary to presumed community-acquired pneumonia/aspiration pneumonia Patient tachypneic, leukocytosis on admission, had encephalopathy requiring intubation as well.  Ultimately, source found to be lobar pneumonia.  Treated with Rocephin here, discharged on cefdinir.        Chronic diastolic CHF.  ECHO this admission with LVEF 55-60%, moderately reduced RV systolic function.    HTN HLD CAD Atrial fibrillation upon admission -status post cardioversion in the ER Per chart review looks like patient had AC stopped in 2019 due to CHA2DS2-VASc of 1.  Suspect A-fib worsened due to methamphetamine use. S/p cardioversion in ER, has remained in sinus rhythm since.    AKI  On admission, Cr 1.72. Improved to 1.3 with fluids  Hyperglycemia/prediabetes   Transaminitis Bilirubinemia   Anemia   Suspect malnutrition, unspecified- at risk refeeding syndeome Failure to thrive/deconditioning. Unintentional weight loss Depression Substance abuse Wife died in 10/17/2022. 60 to 70 pounds weight loss over the past few months.  Increased substance use since that time.          [facility] Controlled Substances Registry was reviewed for this patient prior to discharge.  Consultants: Critical care   Disposition: Home patient was recommended by our team to discharge to SNF, but refused   DISCHARGE MEDICATION: Allergies as of 02/12/2022   No Known Allergies      Medication List     STOP taking these medications    gabapentin 800 MG tablet Commonly known as: NEURONTIN   lisinopril-hydrochlorothiazide 10-12.5  MG tablet Commonly known as: ZESTORETIC       TAKE these medications    aspirin EC 325 MG tablet Take 1 tablet (325 mg total) by mouth daily. What changed:  medication strength how much to take   cefdinir 300 MG capsule Commonly known as: OMNICEF Take 1 capsule (300 mg total) by mouth  2 (two) times daily.   citalopram 40 MG tablet Commonly known as: CELEXA Take 40 mg by mouth daily.   gemfibrozil 600 MG tablet Commonly known as: LOPID Take 600 mg by mouth 2 (two) times daily.   metoprolol succinate 100 MG 24 hr tablet Commonly known as: TOPROL-XL Take 100 mg by mouth daily. Take with or immediately following a meal.   nitroGLYCERIN 0.4 MG SL tablet Commonly known as: NITROSTAT Place 1 tablet (0.4 mg total) under the tongue every 5 (five) minutes as needed for chest pain.   pramipexole 1 MG tablet Commonly known as: MIRAPEX Take 1 mg by mouth at bedtime.   pravastatin 40 MG tablet Commonly known as: PRAVACHOL Take 40 mg by mouth at bedtime.               Discharge Care Instructions  (From admission, onward)           Start     Ordered   02/12/22 0000  Discharge wound care:       Comments: Cover with barrier dressing   02/12/22 1424            Follow-up Information     [facility] Outpatient therapy Follow up.   Why: The outpatient therapy will contact you for the first appointment Contact information: [address] [place], [place] [address] [phone]                Discharge Instructions     Ambulatory referral to Occupational Therapy   Complete by: As directed    Ambulatory referral to Physical Therapy   Complete by: As directed    Discharge instructions   Complete by: As directed    **IMPORTANT DISCHARGE INSTRUCTIONS**    From Dr. Maryfrances Bunnell: You were admitted for a seizure  Per [facility] statutes, patients with seizures are not allowed to drive until  they have been seizure-free for six months. Use caution when using heavy equipment or power tools. Avoid working on ladders or at heights. Take showers instead of baths. Ensure the water temperature is not too high on the home water heater. Do not go swimming alone. When caring for infants or small children, sit down when holding, feeding, or  changing them to minimize risk of injury to the child in the event you have a seizure.  To reduce risk of seizures, maintain good sleep hygiene avoid alcohol and illicit drug use   You were evaluated here with imaging of the brain and we believe your seizure was from methamphetamine use, based on the fact that amphetamines were present in your system on admission, this is a common cause of seizures, and we found no other explanation after extensive testing and work up here  Avoid methamphetamine and amphetamine in all forms from now on  Resume your home medicines  We have on file that you take metoprolol, you should resume this  We also have on file that you take gemfibrozil and pravastatin (cholesterol medicines), mirapex (a movement medicine), and celexa (an antidepressant)   Resume all of these if you have them  For your Afib, resume full dose aspirin  325 mg  Call your primary doctor for a follow up in 1 week  If you haven't seen your heart doctor, Dr. Tomie China, call his office for a follow up as   If you take gabapentin (a nerve medicine) or lisinopril-HCTZ (a blood pressure medicine), don't restart them without talking to your primary care doctor   Lastly, here, we were treating for a pneumonia (Infection in the lung) You should finish treatment with 5 more days of antibiotics Take the antibiotic cefdinir 300 mg twice daily for 5 more days starting tonight   Discharge wound care:   Complete by: As directed    Cover with barrier dressing   Increase activity slowly   Complete by: As directed        Discharge Exam: Filed Weights   02/10/22 0403 02/11/22 0500 02/12/22 0500  Weight: 93.8 kg 89.8 kg 88.6 kg    General: Pt is alert, awake, not in acute distress, uncomfortable and irritable Cardiovascular: Irregular rhythm, controlled rate, nl S1-S2, no murmurs appreciated.   No LE edema.   Respiratory: Normal respiratory rate and rhythm.  CTAB without rales or  wheezes. Abdominal: Abdomen soft and non-tender.  No distension or HSM.   Neuro/Psych: Strength symmetric in upper and lower extremities.  Judgment and insight appear poor but short term memory and orientation normal.   Condition at discharge: stable  The results of significant diagnostics from this hospitalization (including imaging, microbiology, ancillary and laboratory) are listed below for reference.   Imaging Studies: VAS [place] LOWER EXTREMITY VENOUS (DVT)  Result Date: 02/10/2022  Lower Venous DVT Study Patient Name:  ELGIE MAZIARZ [facility]  Date of Exam:   02/10/2022 Medical Rec #: [id number]           Accession #:    [id number] Date of Birth: Feb 04, 1961            Patient Gender: M Patient Age:   49 years Exam Location:  [facility] Procedure:      VAS [place] LOWER EXTREMITY VENOUS (DVT) Referring Phys: MURALI RAMASWAMY --------------------------------------------------------------------------------  Indications: Edema.  Risk Factors: Hx of substance abuse. Comparison Study: No prior studies. Performing Technologist: Jean Rosenthal RDMS, RVT  Examination Guidelines: A complete evaluation includes B-mode imaging, spectral Doppler, color Doppler, and power Doppler as needed of all accessible portions of each vessel. Bilateral testing is considered an integral part of a complete examination. Limited examinations for reoccurring indications may be performed as noted. The reflux portion of the exam is performed with the patient in reverse Trendelenburg.  +---------+---------------+---------+-----------+----------+--------------+ RIGHT    CompressibilityPhasicitySpontaneityPropertiesThrombus Aging +---------+---------------+---------+-----------+----------+--------------+ CFV      Full           Yes      Yes                                 +---------+---------------+---------+-----------+----------+--------------+ SFJ      Full                                                         +---------+---------------+---------+-----------+----------+--------------+ FV Prox  Full                                                        +---------+---------------+---------+-----------+----------+--------------+  FV Mid   Full                                                        +---------+---------------+---------+-----------+----------+--------------+ FV DistalFull                                                        +---------+---------------+---------+-----------+----------+--------------+ PFV      Full                                                        +---------+---------------+---------+-----------+----------+--------------+ POP      Full           Yes      Yes                                 +---------+---------------+---------+-----------+----------+--------------+ PTV      Full                                                        +---------+---------------+---------+-----------+----------+--------------+ PERO     Full                                                        +---------+---------------+---------+-----------+----------+--------------+ Gastroc  Full                                                        +---------+---------------+---------+-----------+----------+--------------+   +---------+---------------+---------+-----------+----------+--------------+ LEFT     CompressibilityPhasicitySpontaneityPropertiesThrombus Aging +---------+---------------+---------+-----------+----------+--------------+ CFV      Full           Yes      Yes                                 +---------+---------------+---------+-----------+----------+--------------+ SFJ      Full                                                        +---------+---------------+---------+-----------+----------+--------------+ FV Prox  Full                                                         +---------+---------------+---------+-----------+----------+--------------+  FV Mid   Full                                                        +---------+---------------+---------+-----------+----------+--------------+ FV DistalFull                                                        +---------+---------------+---------+-----------+----------+--------------+ PFV      Full                                                        +---------+---------------+---------+-----------+----------+--------------+ POP      Full           Yes      Yes                                 +---------+---------------+---------+-----------+----------+--------------+ PTV      Full                                                        +---------+---------------+---------+-----------+----------+--------------+ PERO     Full                                                        +---------+---------------+---------+-----------+----------+--------------+ Gastroc  Full                                                        +---------+---------------+---------+-----------+----------+--------------+     Summary: RIGHT: - There is no evidence of deep vein thrombosis in the lower extremity.  - No cystic structure found in the popliteal fossa.  LEFT: - There is no evidence of deep vein thrombosis in the lower extremity.  - No cystic structure found in the popliteal fossa.  *See table(s) above for measurements and observations. Electronically signed by Coral Else MD on 02/10/2022 at 9:03:03 PM.    Final    CT HEAD WO CONTRAST ( )  Result Date: 02/09/2022 CLINICAL DATA:  New onset seizure activity EXAM: CT HEAD WITHOUT CONTRAST TECHNIQUE: Contiguous axial images were obtained from the base of the skull through the vertex without intravenous contrast. RADIATION DOSE REDUCTION: This exam was performed according to the departmental dose-optimization program which includes automated exposure  control, adjustment of the mA and/or kV according to patient size and/or use of iterative reconstruction technique. COMPARISON:  02/07/2022 FINDINGS: Brain: No evidence of acute infarction, hemorrhage, hydrocephalus, extra-axial collection or mass lesion/mass effect. Vascular: No hyperdense vessel or  unexpected calcification. Skull: Normal. Negative for fracture or focal lesion. Sinuses/Orbits: Orbits and their contents are within normal limits. Mucosal retention cysts are noted within the maxillary and sphenoid sinuses. Other: None IMPRESSION: No acute intracranial abnormality noted. Electronically Signed   By: Alcide CleverMark  Lukens M.D.   On: 02/09/2022 22:02   MR BRAIN WO CONTRAST  Result Date: 02/09/2022 CLINICAL DATA:  Altered mental status EXAM: MRI HEAD WITHOUT CONTRAST TECHNIQUE: Multiplanar, multiecho pulse sequences of the brain and surrounding structures were obtained without intravenous contrast. COMPARISON:  CT/CTA head and neck 2 days prior. FINDINGS: Brain: There is sulcal FLAIR hyperintensity overlying the bilateral parietal and occipital lobes there is no other evidence of acute intracranial hemorrhage or extra-axial fluid collection. There is no or acute infarct. Parenchymal volume is normal. The ventricles are normal in size. Gray-white differentiation is preserved. Parenchymal signal is essentially normal, with a minimal burden of signal abnormality in the supratentorial white matter. There is no mass lesion.  There is no mass effect or midline shift. Vascular: Normal flow voids. Skull and upper cervical spine: Normal marrow signal. Sinuses/Orbits: There is mucosal thickening throughout the paranasal sinuses. The globes and orbits are unremarkable. Other: None. IMPRESSION: 1. Sulcal FLAIR hyperintensity is favored to be related to intubation/oxygenation. Repeat noncontrast head CT is recommended to exclude the less likely possibility of subarachnoid hemorrhage. 2. Otherwise, no acute intracranial  pathology. Electronically Signed   By: Lesia HausenPeter  Noone M.D.   On: 02/09/2022 12:44   DG Abd 1 View  Result Date: 02/09/2022 CLINICAL DATA:  Abdominal pain. EXAM: ABDOMEN - 1 VIEW COMPARISON:  CT scan from 2013. FINDINGS: The NG tube is coiled in the fundal region of the stomach. The bowel gas pattern is unremarkable. The bony structures are unremarkable. IMPRESSION: NG tube is coiled in the fundal region of the stomach. Electronically Signed   By: Rudie MeyerP.  Gallerani M.D.   On: 02/09/2022 11:10   ECHOCARDIOGRAM COMPLETE  Result Date: 02/08/2022    ECHOCARDIOGRAM REPORT   Patient Name:   Eston EstersWilliam P Queenan Date of Exam: 02/08/2022 Medical Rec #:  [id number]          Height:       72.0 in Accession #:    [id number]         Weight:       188.9 lb Date of Birth:  11/25/1960           BSA:          2.080 m Patient Age:    61 years           BP:           98/67 mmHg Patient Gender: M                  HR:           80 bpm. Exam Location:  Inpatient Procedure: 2D Echo, Cardiac Doppler, Color Doppler and Intracardiac            Opacification Agent Indications:    Atrial fibrillation  History:        Patient has prior history of Echocardiogram examinations, most                 recent 03/19/2012. CAD, Arrythmias:Atrial Fibrillation; Risk                 Factors:Hypertension. Polysubstance abuse.  Sonographer:    Ross LudwigArthur Guy RDCS (AE) Referring Phys: Laurier NancyVINEET SOOD  Sonographer Comments: Technically challenging study  due to limited acoustic windows, Technically difficult study due to poor echo windows, suboptimal parasternal window, suboptimal apical window, suboptimal subcostal window and echo performed with patient supine and on artificial respirator. IMPRESSIONS  1. Left ventricular ejection fraction, by estimation, is 55 to 60%. The left ventricle has normal function. Left ventricular endocardial border not optimally defined to evaluate regional wall motion. Left ventricular diastolic parameters are indeterminate. Definity  contrast utilized.  2. Right ventricular systolic function is moderately reduced. The right ventricular size is mildly enlarged. Tricuspid regurgitation signal is inadequate for assessing PA pressure.  3. The mitral valve is grossly normal. Trivial mitral valve regurgitation.  4. The aortic valve is tricuspid. Aortic valve regurgitation is not visualized. Aortic valve mean gradient measures 2.0 mmHg.  5. Unable to estimate CVP on ventilator. Comparison(s): Prior images unable to be directly viewed. FINDINGS  Left Ventricle: Left ventricular ejection fraction, by estimation, is 55 to 60%. The left ventricle has normal function. Left ventricular endocardial border not optimally defined to evaluate regional wall motion. Definity contrast agent was given IV to delineate the left ventricular endocardial borders. The left ventricular internal cavity size was normal in size. There is no left ventricular hypertrophy. Left ventricular diastolic parameters are indeterminate. Right Ventricle: The right ventricular size is mildly enlarged. No increase in right ventricular wall thickness. Right ventricular systolic function is moderately reduced. Tricuspid regurgitation signal is inadequate for assessing PA pressure. Left Atrium: Left atrial size was normal in size. Right Atrium: Right atrial size was normal in size. Pericardium: There is no evidence of pericardial effusion. Mitral Valve: The mitral valve is grossly normal. Trivial mitral valve regurgitation. Tricuspid Valve: The tricuspid valve is not well visualized. Tricuspid valve regurgitation is trivial. Aortic Valve: The aortic valve is tricuspid. Aortic valve regurgitation is not visualized. Aortic valve mean gradient measures 2.0 mmHg. Aortic valve peak gradient measures 3.6 mmHg. Aortic valve area, by VTI measures 2.23 cm. Pulmonic Valve: The pulmonic valve was grossly normal. Pulmonic valve regurgitation is trivial. Aorta: The aortic root is normal in size and  structure. Venous: Unable to estimate CVP on ventilator. IAS/Shunts: The interatrial septum was not well visualized.  LEFT VENTRICLE PLAX 2D LVIDd:         4.80 cm LVIDs:         4.00 cm LV PW:         1.00 cm LV IVS:        0.70 cm LVOT diam:     1.90 cm LV SV:         38 LV SV Index:   18 LVOT Area:     2.84 cm  RIGHT VENTRICLE             IVC RV Basal diam:  3.40 cm     IVC diam: 1.90 cm RV S prime:     11.90 cm/s TAPSE (M-mode): 1.9 cm LEFT ATRIUM           Index       RIGHT ATRIUM           Index LA diam:      2.00 cm 0.96 cm/m  RA Area:     14.90 cm LA Vol (A4C): 19.5 ml 9.38 ml/m  RA Volume:   40.20 ml  19.33 ml/m  AORTIC VALVE AV Area (Vmax):    2.28 cm AV Area (Vmean):   2.21 cm AV Area (VTI):     2.23 cm AV Vmax:  95.50 cm/s AV Vmean:          62.700 cm/s AV VTI:            0.169 m AV Peak Grad:      3.6 mmHg AV Mean Grad:      2.0 mmHg LVOT Vmax:         76.80 cm/s LVOT Vmean:        48.800 cm/s LVOT VTI:          0.133 m LVOT/AV VTI ratio: 0.79  AORTA Ao Root diam: 3.00 cm  SHUNTS Systemic VTI:  0.13 m Systemic Diam: 1.90 cm Nona Dell MD Electronically signed by Nona Dell MD Signature Date/Time: 02/08/2022/1:53:54 PM    Final    Overnight EEG with video  Result Date: 02/08/2022 Charlsie Quest, MD     02/09/2022  5:40 PM Patient Name: AHMOD GILLESPIE MRN: [id number] Epilepsy Attending: Charlsie Quest Referring Physician/Provider: Erick Blinks, MD Duration: 02/07/2022 2303 to 02/08/2022 2303 Patient history: 61 y.o. male with PMH significant for afibb and taken off AC in 2019 due to chads score of 1, HTN, HLD, obseity, prior cardiac arrest in 2014 during lumbar spine surgery who presents with confusion, concern for aphasia and seizure x 2.  EEG to evaluate for seizure. Level of alertness: comatose AEDs during EEG study: Propofol Technical aspects: This EEG study was done with scalp electrodes positioned according to the 10-20 International system of electrode  placement. Electrical activity was reviewed with band pass filter of 1-70Hz , sensitivity of 7 uV/mm, display speed of 64mm/sec with a  notched filter applied as appropriate. EEG data were recorded continuously and digitally stored.  Video monitoring was available and reviewed as appropriate. Description: EEG showed continuous generalized 3 to 7 Hz theta-delta slowing admixed with an excessive amount of 15 to 18 Hz beta activity distributed symmetrically and diffusely. As sedation was weaned, EEG showed posterior dominant rhythm of 9-10 Hz activity of moderate voltage (25-35 uV) seen predominantly in posterior head regions, symmetric and reactive to eye opening and eye closing. Sleep was characterized by vertex waves, sleep spindles (12 to 14 Hz), maximal frontocentral region.  Hyperventilation and photic stimulation were not performed.   ABNORMALITY - Continuous slow, generalized - Excessive beta, generalized IMPRESSION: This study was initially suggestive of severe diffuse encephalopathy, nonspecific etiology but likely related to sedation. As sedation was weaned, EEG was within normal limits. No seizures or epileptiform discharges were seen throughout the recording. Charlsie Quest   DG Chest Port 1 View  Result Date: 02/08/2022 CLINICAL DATA:  Respiratory failure. EXAM: PORTABLE CHEST 1 VIEW COMPARISON:  February 07, 2022. FINDINGS: The heart size and mediastinal contours are within normal limits. Endotracheal and nasogastric tubes are unchanged in position. Both lungs are clear. The visualized skeletal structures are unremarkable. IMPRESSION: No active disease. Electronically Signed   By: Lupita Raider M.D.   On: 02/08/2022 08:11   CT ANGIO HEAD NECK W WO CM W PERF (CODE STROKE)  Result Date: 02/07/2022 CLINICAL DATA:  Stroke suspected, altered mental status EXAM: CT ANGIOGRAPHY HEAD AND NECK CT PERFUSION BRAIN TECHNIQUE: Multidetector CT imaging of the head and neck was performed using the  standard protocol during bolus administration of intravenous contrast. Multiplanar CT image reconstructions and MIPs were obtained to evaluate the vascular anatomy. Carotid stenosis measurements (when applicable) are obtained utilizing NASCET criteria, using the distal internal carotid diameter as the denominator. Multiphase CT imaging of the brain was performed following IV  bolus contrast injection. Subsequent parametric perfusion maps were calculated using RAPID software. RADIATION DOSE REDUCTION: This exam was performed according to the departmental dose-optimization program which includes automated exposure control, adjustment of the mA and/or kV according to patient size and/or use of iterative reconstruction technique. CONTRAST:  OMNIPAQUE IOHEXOL 350 MG/ML SOLN COMPARISON:  No prior CTA, correlation is made with 02/07/2022 CT head FINDINGS: CT HEAD FINDINGS For noncontrast findings, please see same day CT head. CTA NECK FINDINGS Aortic arch: Standard branching. Imaged portion shows no evidence of aneurysm or dissection. No significant stenosis of the major arch vessel origins. Aortic atherosclerosis. Right carotid system: No evidence of stenosis, dissection, or occlusion. Left carotid system: No evidence of stenosis, dissection, or occlusion. Vertebral arteries: No evidence of stenosis, dissection, or occlusion. Skeleton: No acute osseous abnormality. Edentulous. Degenerative changes in the cervical spine. Status post ACDF C6-C7. Other neck: Endotracheal and orogastric tubes noted. No additional acute finding in the neck. Upper chest: No focal pulmonary opacity or pleural effusion. Mild centrilobular emphysema. Review of the MIP images confirms the above findings CTA HEAD FINDINGS Anterior circulation: Both internal carotid arteries are patent to the termini, without significant stenosis. A1 segments patent. Normal anterior communicating artery. Anterior cerebral arteries are patent to their distal  aspects. No M1 stenosis or occlusion. MCA branches perfused, with somewhat diminished enhancement in the anterior right MCA branches compared to the left (series 8, image 79), although no focal stenosis is seen. Posterior circulation: Vertebral arteries patent to the vertebrobasilar junction without stenosis. Posterior inferior cerebellar arteries patent proximally. Basilar patent to its distal aspect. Superior cerebellar arteries patent proximally. Patent P1 segments. PCAs perfused to their distal aspects without stenosis. The right posterior communicating artery is not visualized. Venous sinuses: As permitted by contrast timing, patent. Anatomic variants: None significant. Review of the MIP images confirms the above findings CT Brain Perfusion Findings: ASPECTS: 10 CBF (<30%) Volume: 0mL Perfusion (Tmax>6.0s) volume: 0mL Mismatch Volume: 0mL Infarction Location:None IMPRESSION: 1. No intracranial large vessel occlusion or significant stenosis. Somewhat diminished enhancement in the anterior right MCA branches compared to the left, although no focal stenosis is seen. 2. No hemodynamically significant stenosis in the neck. 3. No evidence of core infarct or penumbra on CT perfusion. 4. Aortic atherosclerosis. 5. Emphysema. Aortic Atherosclerosis (ICD10-I70.0) and Emphysema (ICD10-J43.9). Electronically Signed   By: Wiliam Ke M.D.   On: 02/07/2022 22:29   DG Chest Port 1 View  Result Date: 02/07/2022 CLINICAL DATA:  Status post intubation. EXAM: PORTABLE CHEST 1 VIEW COMPARISON:  None Available. FINDINGS: The heart size and mediastinal contours are within normal limits. Both lungs are clear. No acute osseous abnormality. Endotracheal tube approximately 5.5 cm above the carina. Feeding tube coursing below the diaphragm with distal tip projecting over the gastric fundus. IMPRESSION: 1. Endotracheal tube approximately 5.5 cm above the carina. 2. Feeding tube coursing below the diaphragm with distal tip  projecting over the gastric fundus. Electronically Signed   By: Larose Hires D.O.   On: 02/07/2022 22:10   CT HEAD CODE STROKE WO CONTRAST  Result Date: 02/07/2022 CLINICAL DATA:  Code stroke.  Unresponsive EXAM: CT HEAD WITHOUT CONTRAST TECHNIQUE: Contiguous axial images were obtained from the base of the skull through the vertex without intravenous contrast. RADIATION DOSE REDUCTION: This exam was performed according to the departmental dose-optimization program which includes automated exposure control, adjustment of the mA and/or kV according to patient size and/or use of iterative reconstruction technique. COMPARISON:  07/02/2013 CT head  with contrast FINDINGS: Brain: No evidence of acute infarction, hemorrhage, cerebral edema, mass, mass effect, or midline shift. No hydrocephalus or extra-axial collection. Vascular: No hyperdense vessel. Skull: Negative for fracture or focal lesion. Sinuses/Orbits: Mucosal thickening in the ethmoid air cells and sphenoid sinuses. Mucous retention cysts in the maxillary sinuses. The orbits are unremarkable. Other: The mastoid air cells are well aerated. ASPECTS [facility] Stroke Program Early CT Score) - Ganglionic level infarction (caudate, lentiform nuclei, internal capsule, insula, M1-M3 cortex): 7 - Supraganglionic infarction (M4-M6 cortex): 3 Total score (0-10 with 10 being normal): 10 IMPRESSION: 1. No acute intracranial process. 2. ASPECTS is 10. Code stroke imaging results were communicated on 02/07/2022 at 10:07 pm to provider Dr. Derry Lory via secure text paging. Electronically Signed   By: Wiliam Ke M.D.   On: 02/07/2022 22:08    Microbiology: Results for orders placed or performed during the hospital encounter of 02/07/22  Blood culture (routine x 2)     Status: None   Collection Time: 02/08/22 12:05 AM   Specimen: BLOOD  Result Value Ref Range Status   Specimen Description BLOOD LEFT ANTECUBITAL  Final   Special Requests   Final    BOTTLES DRAWN  AEROBIC AND ANAEROBIC Blood Culture adequate volume   Culture   Final    NO GROWTH 5 DAYS Performed at [facility], [address], [place], [place] [address]    Report Status 02/13/2022 FINAL  Final  Blood culture (routine x 2)     Status: None   Collection Time: 02/08/22 12:10 AM   Specimen: BLOOD LEFT HAND  Result Value Ref Range Status   Specimen Description BLOOD LEFT HAND  Final   Special Requests   Final    BOTTLES DRAWN AEROBIC AND ANAEROBIC Blood Culture adequate volume   Culture   Final    NO GROWTH 5 DAYS Performed at [facility], [address] [place], [place] [address]    Report Status 02/13/2022 FINAL  Final  MRSA Next Gen by PCR, Nasal     Status: None   Collection Time: 02/08/22 12:30 AM   Specimen: Nasal Mucosa; Nasal Swab  Result Value Ref Range Status   MRSA by PCR Next Gen NOT DETECTED NOT DETECTED Final    Comment: (NOTE) The GeneXpert MRSA Assay (FDA approved for NASAL specimens only), is one component of a comprehensive MRSA colonization surveillance program. It is not intended to diagnose MRSA infection nor to guide or monitor treatment for MRSA infections. Test performance is not FDA approved in patients less than 71 years old. Performed at [facility], [address], [place], [place] [address]   Culture, Respiratory w Gram Stain     Status: None   Collection Time: 02/08/22  5:08 AM   Specimen: Endotracheal; Respiratory  Result Value Ref Range Status   Specimen Description ENDOTRACHEAL  Final   Special Requests NONE  Final   Gram Stain   Final    RARE WBC PRESENT, PREDOMINANTLY PMN MODERATE GRAM POSITIVE COCCI IN PAIRS RARE GRAM POSITIVE RODS RARE GRAM NEGATIVE RODS    Culture   Final    FEW Normal respiratory flora-no Staph aureus or Pseudomonas seen Performed at [facility], [address] [place], [place] [address]    Report Status 02/11/2022 FINAL  Final  Respiratory (~20 pathogens) panel by PCR     Status:  None   Collection Time: 02/08/22 11:47 AM   Specimen: Nasopharyngeal Swab; Respiratory  Result Value Ref Range Status   Adenovirus NOT DETECTED NOT DETECTED Final   Coronavirus 229E NOT DETECTED NOT DETECTED Final    Comment: (NOTE) The Coronavirus on the Respiratory Panel, DOES NOT test for the novel  Coronavirus (2019 nCoV)    Coronavirus HKU1 NOT DETECTED NOT DETECTED Final   Coronavirus NL63 NOT DETECTED NOT DETECTED Final   Coronavirus OC43 NOT DETECTED NOT DETECTED Final   Metapneumovirus NOT DETECTED NOT DETECTED Final   Rhinovirus / Enterovirus NOT DETECTED NOT DETECTED Final   Influenza A NOT DETECTED NOT DETECTED Final   Influenza B NOT DETECTED NOT DETECTED Final   Parainfluenza Virus 1 NOT DETECTED NOT DETECTED Final   Parainfluenza Virus 2 NOT DETECTED NOT DETECTED Final   Parainfluenza Virus 3 NOT DETECTED NOT DETECTED Final   Parainfluenza Virus 4 NOT DETECTED NOT DETECTED Final   Respiratory Syncytial Virus NOT DETECTED NOT DETECTED Final   Bordetella pertussis NOT DETECTED NOT DETECTED Final   Bordetella Parapertussis NOT DETECTED NOT DETECTED Final   Chlamydophila pneumoniae NOT DETECTED NOT DETECTED Final   Mycoplasma pneumoniae NOT DETECTED NOT DETECTED Final    Comment: Performed at [facility], [address] [place], [place] [address]  Urine Culture     Status: None   Collection Time: 02/09/22 11:19 AM   Specimen: Urine, Clean Catch  Result Value Ref Range Status   Specimen Description URINE, CLEAN CATCH  Final   Special Requests NONE  Final   Culture   Final    NO GROWTH Performed at [facility], [address], [place], [place] [address]    Report Status 02/10/2022 FINAL  Final    Labs: CBC: Recent Labs  Lab 02/07/22 2111 02/07/22 2121 02/07/22 2226 02/08/22 0005 02/08/22 0415 02/08/22 1147 02/10/22 0403  WBC 19.1*  --   --  10.6* 11.3* 11.5* 11.5*  NEUTROABS 12.2*  --   --   --   --   --   --   HGB 13.9   < > 12.9* 12.6*  12.3* 11.4* 10.8*  HCT 45.4   < > 38.0* 37.0* 36.6* 33.8* 31.3*  MCV 92.3  --   --  83.9 82.4 83.3 84.4  PLT 435*  --   --  376 364 326 226   < > = values in this interval not displayed.   Basic Metabolic Panel: Recent Labs  Lab 02/07/22 2111 02/07/22 2121 02/07/22 2226 02/08/22 0005 02/08/22 0415 02/09/22 0649 02/09/22 1823 02/10/22 0403 02/12/22 0241  NA 140 139 137  --  140 136 140 135  --   K 3.5 3.1* 3.1*  --  3.1* 3.4* 3.9 3.5  --   CL 104 108  --   --  105 105 105 106  --   CO2 11*  --   --   --  21* 16* 21* 18*  --   GLUCOSE 249* 246*  --   --  77 100* 118* 146*  --   BUN 17 18  --   --  16 12 8  6*  --   CREATININE 1.72* 1.40*  --  1.34* 1.27* 1.50* 1.43* 1.29*  --   CALCIUM 9.4  --   --   --  9.3 8.0* 8.4* 7.7*  --   MG  --   --   --   --  2.3 1.9  --  1.8 1.9  PHOS  --   --   --   --  2.3* 4.0  --  4.7*  --    Liver Function Tests: Recent Labs  Lab 02/07/22 2111 02/09/22 0649 02/10/22 0403  AST 55* 22 32  ALT 46* 24 31  ALKPHOS 89 70 90  BILITOT 2.3* 1.9* 3.1*  PROT 7.1 5.7* 5.2*  ALBUMIN 3.8 2.6* 2.2*   CBG: Recent Labs  Lab 02/11/22 1126 02/11/22 1618 02/11/22 1950 02/11/22 2340 02/12/22 0346  GLUCAP 123* 146* 97 160* 116*    Discharge time spent: approximately 35 minutes spent on discharge counseling, evaluation of patient on day of discharge, and coordination of discharge planning with nursing, social work, pharmacy and case management  Signed: Alberteen Sam, MD [facility] 02/13/2022

## 2022-02-12 NOTE — TOC Transition Note (Addendum)
Transition of Care [facility]) - CM/SW Discharge Note   Patient Details  Name: Justin Tanner MRN: [id number] Date of Birth: September 18, 1960  Transition of [facility]) CM/SW Contact:  Kermit Balo, RN Phone Number: 02/12/2022, 2:23 PM   Clinical Narrative:    PCP: Gaye Alken in [place] Pt is discharging home with outpatient therapy. Pt has denied SNF rehab and his sister has agreed to provide extra support at home. CM is unable to arrange home health therapies due to his substance abuse at home prior to admission. Pt and sister agreeable to outpatient therapy. This has been arranged through [facility] therapy. Information on the AVS.  Pt has walker at home.  Sister states she and her family can provide extra support at pts home and provide needed transportation.  Sister will provide transport home.    Final next level of care: OP Rehab Barriers to Discharge: No Barriers Identified   Patient Goals and CMS Choice   CMS Medicare.gov Compare Post Acute Care list provided to:: Patient Choice offered to / list presented to : Patient, Sibling  Discharge Placement                       Discharge Plan and Services                                     Social Determinants of Health (SDOH) Interventions     Readmission Risk Interventions     No data to display

## 2022-02-12 NOTE — Hospital Course (Signed)
Justin Tanner, is a 61 y.o. male, past medical history of polysubstance abuse, CAD, HLD, HTN, Afib who presented to the [facility] ED with a chief complaint of altered mental status, code stroke was activated, CT head negative.  EEG negative.  UDS positive for amphetamines and THC.  Patient was found to be in A-fib.  Cardioversion was attempted and he converted to sinus rhythm but soon converted back to A-fib.  He was intubated post cardioversion, Keppra was loaded.  Admitted under ICU.  Self extubated on 02/10/2022 and was doing well on 3 L oxygen and transferred to [facility] on 02/11/2022.  Also he was started on empiric treatment for meningitis but due to low suspicion, those antibiotics were discontinued but then he had low-grade fever and respiratory aspirate was growing gram-positive cocci so he was resumed on Rocephin.   Significant Hospital Events: Including procedures, antibiotic start and stop dates in addition to other pertinent events   11/24 Admit. PCCM consult. EEG>, CTH neg>, Intubated>, Vanc/Ceftriaxone> Dilt 5mg , prop, 50, fent 50, versed 3 > Got keppr ain ED   11/25 -currently remains on ventilator FiO2 30%.  He is on propofol infusion Fentanyl infusion. Jsut off Versed infusion. Now in Sinus:  and Cardizem drip off.  On thiamine. -   Has low-grade fever.   White count 11.3 K.  Culture negative so far.  On antibiotics ceftriaxone and vancomycin.  On c-EEG. No anti-epileptics currently. UDS+ for THC, amphetatmines, benzos. Followed commands for RN RVP negative Tracheal aspirate: Polymicrobial Gram stain positive MRSA PCR negative 11/26-  Overnight hypoglycemic and D50 given.  EEG yesterday showed no seizures.  He has temperature to 101 Fahrenheit with white count of 11.5 K.  Lactic acid up to 2 . Tracheal aspirate growing gram-positive cocci and rare gram-negative rods. Remain on Vent - diprivan gtt- > able to follow command but gets agitated.  11/27 - self-extubated. Doing well on 3L [place].  Continues to have low-grade fevers and leukocytosis, unclear etiology. On empiric rocephin. No further seizure episodes, neuro signed off. No need for AED as this was likely provoked seizure.

## 2022-02-12 NOTE — Progress Notes (Signed)
Mobility Specialist: Progress Note   02/12/22 1610  Mobility  Activity Ambulated with assistance in hallway  Level of Assistance Minimal assist, patient does 75% or more  Assistive Device Front wheel walker  Distance Ambulated (ft) 180 ft  Activity Response Tolerated well  Mobility Referral Yes  $Mobility charge 1 Mobility   Pt received in the bed and agreeable to mobility. Mod I with bed mobility and minA to stand. No c/o throughout. Cues for bigger steps as pt began ambulation with short shuffle steps. Pt sitting EOB after session with call bell and phone in reach. Bed alarm is on.   K[employer]apps Mobility Specialist Please contact via SecureChat or Rehab office at [phone]

## 2022-02-12 NOTE — Care Management Important Message (Signed)
Important Message  Patient Details  Name: Justin Tanner MRN: [id number] Date of Birth: 25-Dec-1960   Medicare Important Message Given:  Yes     Aylssa Herrig 02/12/2022, 11:23 AM

## 2022-02-12 NOTE — Progress Notes (Signed)
Mobility Specialist: Progress Note   02/12/22 1120  Mobility  Activity Ambulated with assistance to bathroom  Level of Assistance Minimal assist, patient does 75% or more  Assistive Device Front wheel walker  Distance Ambulated (ft) 15 ft  Activity Response Tolerated well  Mobility Referral Yes  $Mobility charge 1 Mobility   Pt assisted to the BR per request. Mod I with bed mobility and minA to stand. Verbal cues for RW proximity and upright posture. Instructed pt to pull call string when finished to be assisted back to bed.   Ebonique Hallstrom Mobility Specialist Please contact via SecureChat or Rehab office at [phone]

## 2022-02-13 LAB — CULTURE, BLOOD (ROUTINE X 2)
Culture: NO GROWTH
Culture: NO GROWTH
Special Requests: ADEQUATE
Special Requests: ADEQUATE

## 2022-02-14 DIAGNOSIS — E1169 Type 2 diabetes mellitus with other specified complication: Secondary | ICD-10-CM | POA: Diagnosis not present

## 2022-02-14 DIAGNOSIS — I4891 Unspecified atrial fibrillation: Secondary | ICD-10-CM | POA: Diagnosis not present

## 2022-02-14 DIAGNOSIS — I10 Essential (primary) hypertension: Secondary | ICD-10-CM | POA: Diagnosis not present

## 2022-02-14 DIAGNOSIS — M542 Cervicalgia: Secondary | ICD-10-CM | POA: Diagnosis not present

## 2022-02-14 DIAGNOSIS — R569 Unspecified convulsions: Secondary | ICD-10-CM | POA: Diagnosis not present

## 2022-02-14 DIAGNOSIS — G8929 Other chronic pain: Secondary | ICD-10-CM | POA: Diagnosis not present

## 2022-02-14 DIAGNOSIS — Z79899 Other long term (current) drug therapy: Secondary | ICD-10-CM | POA: Diagnosis not present

## 2022-03-04 DIAGNOSIS — M542 Cervicalgia: Secondary | ICD-10-CM | POA: Diagnosis not present

## 2022-03-04 DIAGNOSIS — M549 Dorsalgia, unspecified: Secondary | ICD-10-CM | POA: Diagnosis not present

## 2022-03-04 DIAGNOSIS — G8929 Other chronic pain: Secondary | ICD-10-CM | POA: Diagnosis not present

## 2022-03-04 DIAGNOSIS — Z79899 Other long term (current) drug therapy: Secondary | ICD-10-CM | POA: Diagnosis not present

## 2022-03-18 DIAGNOSIS — M549 Dorsalgia, unspecified: Secondary | ICD-10-CM | POA: Diagnosis not present

## 2022-03-18 DIAGNOSIS — Z79899 Other long term (current) drug therapy: Secondary | ICD-10-CM | POA: Diagnosis not present

## 2022-03-18 DIAGNOSIS — G8929 Other chronic pain: Secondary | ICD-10-CM | POA: Diagnosis not present

## 2022-03-18 DIAGNOSIS — M542 Cervicalgia: Secondary | ICD-10-CM | POA: Diagnosis not present

## 2022-08-21 DIAGNOSIS — I10 Essential (primary) hypertension: Secondary | ICD-10-CM | POA: Diagnosis not present

## 2022-08-21 DIAGNOSIS — L03113 Cellulitis of right upper limb: Secondary | ICD-10-CM | POA: Diagnosis not present

## 2022-08-21 DIAGNOSIS — S41151A Open bite of right upper arm, initial encounter: Secondary | ICD-10-CM | POA: Diagnosis not present

## 2022-08-21 DIAGNOSIS — W540XXA Bitten by dog, initial encounter: Secondary | ICD-10-CM | POA: Diagnosis not present
